# Patient Record
Sex: Male | Born: 1965
Health system: Southern US, Community
[De-identification: ages and names within clinical notes are randomized; demographics above are authoritative.]

## PROBLEM LIST (undated history)

## (undated) DIAGNOSIS — E1169 Type 2 diabetes mellitus with other specified complication: Secondary | ICD-10-CM

## (undated) DIAGNOSIS — E119 Type 2 diabetes mellitus without complications: Secondary | ICD-10-CM

## (undated) DIAGNOSIS — I10 Essential (primary) hypertension: Secondary | ICD-10-CM

## (undated) DIAGNOSIS — G4733 Obstructive sleep apnea (adult) (pediatric): Secondary | ICD-10-CM

## (undated) HISTORY — DX: Obstructive sleep apnea (adult) (pediatric): G47.33

## (undated) HISTORY — DX: Hyperlipidemia, unspecified: E11.69

## (undated) HISTORY — PX: FEMUR FRACTURE SURGERY: SHX633

## (undated) HISTORY — DX: Morbid (severe) obesity due to excess calories: E66.01

## (undated) HISTORY — DX: Type 2 diabetes mellitus without complications: E11.9

## (undated) HISTORY — PX: APPENDECTOMY: SHX54

---

## 2004-12-07 ENCOUNTER — Emergency Department (HOSPITAL_COMMUNITY): Admission: AD | Admit: 2004-12-07 | Discharge: 2004-12-07 | Payer: Self-pay | Admitting: Family Medicine

## 2009-01-23 ENCOUNTER — Encounter: Admission: RE | Admit: 2009-01-23 | Discharge: 2009-04-23 | Payer: Self-pay | Admitting: Family Medicine

## 2009-08-02 ENCOUNTER — Observation Stay (HOSPITAL_COMMUNITY): Admission: EM | Admit: 2009-08-02 | Discharge: 2009-08-03 | Payer: Self-pay | Admitting: Emergency Medicine

## 2009-08-02 ENCOUNTER — Encounter (INDEPENDENT_AMBULATORY_CARE_PROVIDER_SITE_OTHER): Payer: Self-pay | Admitting: Surgery

## 2010-02-18 ENCOUNTER — Emergency Department (HOSPITAL_COMMUNITY): Admission: EM | Admit: 2010-02-18 | Discharge: 2010-02-18 | Payer: Self-pay | Admitting: Family Medicine

## 2010-12-25 LAB — DIFFERENTIAL
Eosinophils Absolute: 0.1 10*3/uL (ref 0.0–0.7)
Eosinophils Relative: 0 % (ref 0–5)
Lymphs Abs: 2 10*3/uL (ref 0.7–4.0)
Monocytes Relative: 13 % — ABNORMAL HIGH (ref 3–12)

## 2010-12-25 LAB — CBC
HCT: 37.7 % — ABNORMAL LOW (ref 39.0–52.0)
Hemoglobin: 13.2 g/dL (ref 13.0–17.0)
MCV: 89.4 fL (ref 78.0–100.0)
RBC: 4.21 MIL/uL — ABNORMAL LOW (ref 4.22–5.81)
WBC: 12.1 10*3/uL — ABNORMAL HIGH (ref 4.0–10.5)

## 2013-09-23 ENCOUNTER — Ambulatory Visit: Payer: Self-pay

## 2013-10-04 ENCOUNTER — Ambulatory Visit: Payer: Self-pay

## 2014-10-18 DIAGNOSIS — F331 Major depressive disorder, recurrent, moderate: Secondary | ICD-10-CM | POA: Insufficient documentation

## 2014-12-11 ENCOUNTER — Telehealth: Payer: Self-pay | Admitting: Internal Medicine

## 2014-12-11 NOTE — Telephone Encounter (Signed)
Called and spoke to patient about appt.  He wanted to speak to Dr. Duanne Guessewey prior to scheduling an appointment due to not knowing the details of why he was being referred to our office.   Dx: Elevated Protein C levels Referring: Dr. Duanne Guessewey

## 2014-12-11 NOTE — Telephone Encounter (Signed)
Patient called back to make appt with Dr. Arbutus PedMohamed on 01/17/15 @ 11am.

## 2015-01-12 ENCOUNTER — Telehealth: Payer: Self-pay | Admitting: Internal Medicine

## 2015-01-12 NOTE — Telephone Encounter (Signed)
NEW PATIENT APPT- LEFT MESSAGE FOR PATIENT TO RETURN CALL TO RESCHEDULE NP APPT.

## 2015-01-15 ENCOUNTER — Telehealth: Payer: Self-pay | Admitting: Internal Medicine

## 2015-01-15 NOTE — Telephone Encounter (Signed)
new patient referral-left message for patien to return call to r/s np appt

## 2015-01-16 ENCOUNTER — Telehealth: Payer: Self-pay | Admitting: Internal Medicine

## 2015-01-16 NOTE — Telephone Encounter (Signed)
Call home number [phone] not able to leave message vm stated memory is full

## 2015-01-16 NOTE — Telephone Encounter (Signed)
new patient appt-left message for patient at [phone] informing of appt change. left messages on 04/22, 04/25 no return call

## 2015-01-17 ENCOUNTER — Other Ambulatory Visit: Payer: Self-pay

## 2015-01-17 ENCOUNTER — Ambulatory Visit: Payer: Self-pay

## 2015-01-17 ENCOUNTER — Ambulatory Visit: Payer: Self-pay | Admitting: Internal Medicine

## 2015-02-26 DIAGNOSIS — I739 Peripheral vascular disease, unspecified: Secondary | ICD-10-CM | POA: Insufficient documentation

## 2015-02-27 ENCOUNTER — Telehealth: Payer: Self-pay | Admitting: Cardiovascular Disease

## 2015-02-27 NOTE — Telephone Encounter (Signed)
Received records from Dr [facility] for appointment on 03/01/15 with Dr Tresa EndoKelly.  [facility] for Dr Landry DykeKelly's schedule on 03/01/15. lp

## 2015-03-01 ENCOUNTER — Ambulatory Visit: Payer: Self-pay | Admitting: Cardiovascular Disease

## 2015-03-23 DIAGNOSIS — M179 Osteoarthritis of knee, unspecified: Secondary | ICD-10-CM | POA: Insufficient documentation

## 2015-03-30 DIAGNOSIS — R55 Syncope and collapse: Secondary | ICD-10-CM | POA: Insufficient documentation

## 2015-04-05 ENCOUNTER — Telehealth: Payer: Self-pay | Admitting: Internal Medicine

## 2015-04-05 NOTE — Telephone Encounter (Signed)
Received [facility] for appointment on 06/04/15 with Dr Rennis GoldenHilty.  Records given to D Chavis (medical records) [facility]. lop

## 2015-06-04 ENCOUNTER — Ambulatory Visit: Payer: Self-pay | Admitting: Internal Medicine

## 2015-12-17 MED FILL — METFORMIN HCL ER 750 MG TAB: 750 | 30 days supply | Qty: 30 | Fill #0

## 2018-06-07 ENCOUNTER — Ambulatory Visit: Payer: BLUE CROSS/BLUE SHIELD | Admitting: Nurse Practitioner

## 2018-06-07 DIAGNOSIS — Z0289 Encounter for other administrative examinations: Secondary | ICD-10-CM

## 2018-06-14 DIAGNOSIS — Z125 Encounter for screening for malignant neoplasm of prostate: Secondary | ICD-10-CM | POA: Diagnosis not present

## 2018-06-14 DIAGNOSIS — Z13220 Encounter for screening for lipoid disorders: Secondary | ICD-10-CM | POA: Diagnosis not present

## 2018-06-14 DIAGNOSIS — Z0000 Encounter for general adult medical examination without abnormal findings: Secondary | ICD-10-CM | POA: Diagnosis not present

## 2018-06-28 DIAGNOSIS — Z0000 Encounter for general adult medical examination without abnormal findings: Secondary | ICD-10-CM | POA: Diagnosis not present

## 2018-06-29 MED FILL — OLMSRTN-AMLDPN-HCTZ 40-10-1: 40-10-12.5 | 90 days supply | Qty: 90 | Fill #0

## 2018-06-29 MED FILL — PRAVASTATIN SODIUM 80 MG TA: 80 | 90 days supply | Qty: 90 | Fill #0

## 2018-06-29 MED FILL — DICLOFENAC SODIUM 1% GEL: 1 | 30 days supply | Qty: 500 | Fill #0

## 2018-06-29 MED FILL — metFORMIN HCL 1000 MG TABS: 1000 | 30 days supply | Qty: 60 | Fill #0

## 2018-07-27 MED FILL — TRULICITY 0.75 MG/0.5 ML PE: 0.75 | 28 days supply | Qty: 2 | Fill #0

## 2018-10-22 MED FILL — metFORMIN HCL 1000 MG TABS: 1000 | 30 days supply | Qty: 60 | Fill #1

## 2019-05-24 DIAGNOSIS — R931 Abnormal findings on diagnostic imaging of heart and coronary circulation: Secondary | ICD-10-CM

## 2019-05-24 HISTORY — DX: Abnormal findings on diagnostic imaging of heart and coronary circulation: R93.1

## 2019-05-24 HISTORY — PX: TRANSTHORACIC ECHOCARDIOGRAM: SHX275

## 2019-06-03 DIAGNOSIS — E7800 Pure hypercholesterolemia, unspecified: Secondary | ICD-10-CM | POA: Diagnosis not present

## 2019-06-03 DIAGNOSIS — F331 Major depressive disorder, recurrent, moderate: Secondary | ICD-10-CM | POA: Diagnosis not present

## 2019-06-03 DIAGNOSIS — E6609 Other obesity due to excess calories: Secondary | ICD-10-CM | POA: Diagnosis not present

## 2019-06-03 DIAGNOSIS — I10 Essential (primary) hypertension: Secondary | ICD-10-CM | POA: Diagnosis not present

## 2019-06-03 DIAGNOSIS — E118 Type 2 diabetes mellitus with unspecified complications: Secondary | ICD-10-CM | POA: Diagnosis not present

## 2019-06-03 DIAGNOSIS — E1165 Type 2 diabetes mellitus with hyperglycemia: Secondary | ICD-10-CM | POA: Diagnosis not present

## 2019-06-03 DIAGNOSIS — I2510 Atherosclerotic heart disease of native coronary artery without angina pectoris: Secondary | ICD-10-CM | POA: Diagnosis not present

## 2019-06-03 DIAGNOSIS — E663 Overweight: Secondary | ICD-10-CM | POA: Diagnosis not present

## 2019-06-03 DIAGNOSIS — R5383 Other fatigue: Secondary | ICD-10-CM | POA: Diagnosis not present

## 2019-06-03 MED FILL — TRULICITY 0.75 MG/0.5 ML PE: 0.75 | 84 days supply | Qty: 6 | Fill #0

## 2019-06-03 MED FILL — VENLAFAXINE HCL ER 150 MG C: 150 | 30 days supply | Qty: 30 | Fill #0

## 2019-06-03 MED FILL — LOSARTAN POTASSIUM 100 MG T: 100 | 30 days supply | Qty: 30 | Fill #0

## 2019-06-03 MED FILL — AMLODIPINE BESYLATE 5 MG TA: 5 | 30 days supply | Qty: 30 | Fill #0

## 2019-06-03 MED FILL — FREESTYLE LANCETS: 90 days supply | Qty: 400 | Fill #0

## 2019-06-03 MED FILL — PRAVASTATIN SODIUM 80 MG TA: 80 | 30 days supply | Qty: 30 | Fill #0

## 2019-06-03 MED FILL — JARDIANCE 10 MG TABLET: 10 | 90 days supply | Qty: 90 | Fill #0

## 2019-06-03 MED FILL — FREESTYLE LITE TEST STRIP: 90 days supply | Qty: 400 | Fill #0

## 2019-06-03 MED FILL — FREESTYLE LITE METER: 1 days supply | Qty: 1 | Fill #0

## 2019-06-04 ENCOUNTER — Emergency Department (HOSPITAL_COMMUNITY)
Admission: EM | Admit: 2019-06-04 | Discharge: 2019-06-04 | Disposition: A | Discharge status: Home / Self Care | Payer: 59 | Attending: Emergency Medicine | Admitting: Emergency Medicine

## 2019-06-04 ENCOUNTER — Encounter (HOSPITAL_COMMUNITY): Payer: Self-pay | Admitting: Emergency Medicine

## 2019-06-04 ENCOUNTER — Other Ambulatory Visit: Payer: Self-pay

## 2019-06-04 DIAGNOSIS — Z79899 Other long term (current) drug therapy: Secondary | ICD-10-CM | POA: Diagnosis not present

## 2019-06-04 DIAGNOSIS — Z20828 Contact with and (suspected) exposure to other viral communicable diseases: Secondary | ICD-10-CM | POA: Diagnosis not present

## 2019-06-04 DIAGNOSIS — Z7982 Long term (current) use of aspirin: Secondary | ICD-10-CM | POA: Insufficient documentation

## 2019-06-04 DIAGNOSIS — R55 Syncope and collapse: Secondary | ICD-10-CM | POA: Diagnosis not present

## 2019-06-04 DIAGNOSIS — R42 Dizziness and giddiness: Secondary | ICD-10-CM | POA: Insufficient documentation

## 2019-06-04 DIAGNOSIS — I10 Essential (primary) hypertension: Secondary | ICD-10-CM | POA: Diagnosis not present

## 2019-06-04 DIAGNOSIS — E119 Type 2 diabetes mellitus without complications: Secondary | ICD-10-CM | POA: Diagnosis not present

## 2019-06-04 DIAGNOSIS — Z7984 Long term (current) use of oral hypoglycemic drugs: Secondary | ICD-10-CM | POA: Insufficient documentation

## 2019-06-04 HISTORY — DX: Essential (primary) hypertension: I10

## 2019-06-04 LAB — CBC
HCT: 49 % (ref 39.0–52.0)
Hemoglobin: 16.4 g/dL (ref 13.0–17.0)
MCH: 30.1 pg (ref 26.0–34.0)
MCHC: 33.5 g/dL (ref 30.0–36.0)
MCV: 90.1 fL (ref 80.0–100.0)
Platelets: 263 10*3/uL (ref 150–400)
RBC: 5.44 MIL/uL (ref 4.22–5.81)
RDW: 12.6 % (ref 11.5–15.5)
WBC: 11.6 10*3/uL — ABNORMAL HIGH (ref 4.0–10.5)
nRBC: 0 % (ref 0.0–0.2)

## 2019-06-04 LAB — BASIC METABOLIC PANEL
Anion gap: 14 (ref 5–15)
BUN: 16 mg/dL (ref 6–20)
CO2: 22 mmol/L (ref 22–32)
Calcium: 9.3 mg/dL (ref 8.9–10.3)
Chloride: 101 mmol/L (ref 98–111)
Creatinine, Ser: 0.85 mg/dL (ref 0.61–1.24)
GFR calc Af Amer: >60 mL/min (ref >60)
GFR calc non Af Amer: >60 mL/min (ref >60)
Glucose, Bld: 119 mg/dL — ABNORMAL HIGH (ref 70–99)
Potassium: 4 mmol/L (ref 3.5–5.1)
Sodium: 137 mmol/L (ref 135–145)

## 2019-06-04 LAB — TROPONIN I (HIGH SENSITIVITY)
Troponin I (High Sensitivity): 14 ng/L (ref <18)
Troponin I (High Sensitivity): 12 ng/L (ref <18)

## 2019-06-04 MED ORDER — SODIUM CHLORIDE 0.9 % IV BOLUS
1000.0000 mL | Freq: Once | INTRAVENOUS | Status: AC
  Administered 2019-06-04: 1000 mL via INTRAVENOUS

## 2019-06-04 NOTE — ED Provider Notes (Signed)
[facility]-EMERGENCY DEPT Provider Note   CSN: [id number] Arrival date & time: 06/04/19  1801     History   Chief Complaint Chief Complaint  Patient presents with  . Dizziness    HPI Robert Lewis is a 53 y.o. male with a past medical history of obesity, type 2 diabetes, hypertension, chewing tobacco use, presented to the emergency department with episode of near syncope.  Patient reports that he was on the toilet yesterday attempting have a bowel movement and straining, when he began to feel very lightheaded and nauseous.  This lasted about 30 seconds and then improved.  He did not lose consciousness.  He denies any palpitations or chest pain at that time.  He says is never happened before.  Today he reports while he was driving around 4 PM he again had a transient episode of lightheadedness, this time in the setting of no straining.  This lasted a few seconds and improved.  Of note the patient was started on losartan 100 mg by mouth daily is a new blood pressure medicine which he take his first dose of this morning.  He was also started on Januvia and Trulicity for his diabetes.  He has not been taking his Januvia as instructed by his primary care provider until he is able to obtain a meter to evaluate his blood sugars.  Patient reports feeling general malaise with headaches and "not like myself" for the past month.  He denies any cough congestion or sick contacts.  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  He denies any history of exertional chest pain or dyspnea on exertion.  He reports he did stress test many years ago but has not followed up with cardiology since then.  He reports a family history of MI in his father who died at age 34.  No hemoptysis or asymmetric LE edema. Patient denies personal or family history of DVT or PE. No recent hormone use (including OCP); travel for >6 hours; prolonged immobilization for greater than 3 days;  surgeries or trauma in the last 4 weeks; or malignancy with treatment within 6 months.    HPI  Past Medical History:  Diagnosis Date  . Diabetes mellitus without complication (HCC)   . Hypertension     There are no active problems to display for this patient.   History reviewed. No pertinent surgical history.      Home Medications    Prior to Admission medications   Medication Sig Start Date End Date Taking? Authorizing Provider  amLODipine (NORVASC) 5 MG tablet Take 5 mg by mouth daily. 06/03/19  Yes [provider]  aspirin EC 81 MG tablet Take 81 mg by mouth daily.   Yes [provider]  JARDIANCE 10 MG TABS tablet Take 10 mg by mouth daily. 06/03/19  Yes [provider]  losartan (COZAAR) 100 MG tablet Take 100 mg by mouth daily. 06/03/19  Yes [provider]  pravastatin (PRAVACHOL) 80 MG tablet Take 80 mg by mouth daily. 06/03/19  Yes [provider]  TRULICITY 0.75 MG/0.5ML SOPN Inject 0.75 mg into the skin every 7 (seven) weeks. 06/03/19  Yes [provider]  venlafaxine XR (EFFEXOR-XR) 150 MG 24 hr capsule Take 150 mg by mouth daily. 06/03/19  Yes [provider]    Family History No family history on file.  Social History Social History   Tobacco Use  . Smoking status: Not on file  Substance  Use Topics  . Alcohol use: Not on file  . Drug use: Not on file     Allergies   Patient has no known allergies.   Review of Systems Review of Systems  Constitutional: Negative for chills and fever.  Eyes: Negative for photophobia and visual disturbance.  Respiratory: Negative for cough and shortness of breath.   Cardiovascular: Negative for chest pain and palpitations.  Gastrointestinal: Positive for nausea. Negative for abdominal pain and vomiting.  Musculoskeletal: Negative for arthralgias and back pain.  Skin: Negative for pallor and rash.  Neurological: Positive for dizziness and light-headedness.  Negative for seizures, syncope, facial asymmetry, weakness and headaches.  Psychiatric/Behavioral: Negative for agitation and confusion.  All other systems reviewed and are negative.    Physical Exam Updated Vital Signs BP (!) 152/79   Pulse 67   Temp 99 F (37.2 C) (Oral)   Resp 20   SpO2 97%   Physical Exam Vitals signs and nursing note reviewed.  Constitutional:      Appearance: He is well-developed.     Comments: Obese  HENT:     Head: Normocephalic and atraumatic.  Eyes:     Conjunctiva/sclera: Conjunctivae normal.  Neck:     Musculoskeletal: Neck supple.  Cardiovascular:     Rate and Rhythm: Normal rate and regular rhythm.     Pulses: Normal pulses.     Heart sounds: No murmur.  Pulmonary:     Effort: Pulmonary effort is normal. No respiratory distress.     Breath sounds: Normal breath sounds.  Abdominal:     Palpations: Abdomen is soft.     Tenderness: There is no abdominal tenderness.  Skin:    General: Skin is warm and dry.  Neurological:     General: No focal deficit present.     Mental Status: He is alert and oriented to person, place, and time.     Sensory: No sensory deficit.     Motor: No weakness.     Gait: Gait normal.  Psychiatric:        Mood and Affect: Mood normal.        Behavior: Behavior normal.      ED Treatments / Results  Labs (all labs ordered are listed, but only abnormal results are displayed) Labs Reviewed  BASIC METABOLIC PANEL - Abnormal; Notable for the following components:      Result Value   Glucose, Bld 119 (*)    All other components within normal limits  CBC - Abnormal; Notable for the following components:   WBC 11.6 (*)    All other components within normal limits  SARS CORONAVIRUS 2 (TAT 6-24 HRS)  TROPONIN I (HIGH SENSITIVITY)  TROPONIN I (HIGH SENSITIVITY)    EKG EKG Interpretation  Date/Time:  Saturday June 04 2019 18:59:01 EDT Ventricular Rate:  76 PR Interval:    QRS Duration: 111 QT  Interval:  386 QTC Calculation: 434 R Axis:   -17 Text Interpretation:  Sinus rhythm Left ventricular hypertrophy No STEMI  Confirmed by [facility], Cadyn Fann [phone]([id number]) on 06/04/2019 8:18:59 PM   Radiology No results found.  Procedures Procedures (including critical care time)  Medications Ordered in ED Medications  sodium chloride 0.9 % bolus 1,000 mL (0 mLs Intravenous Stopped 06/04/19 2140)     Initial Impression / Assessment and Plan / ED Course  I have reviewed the triage vital signs and the nursing notes.  Pertinent labs & imaging results that were available during my care of the patient were  reviewed by me and considered in my medical decision making (see chart for details).  53 year old male with history as noted above presenting to the emergency department with 2 transient episodes of lightheadedness versus near syncope.  The first occurred while he was on the toilet having a bowel movement, and is quite consistent with a vasovagal episode.  He has reported that he has gotten lightheaded with vagal straining in the past.  Second episode occurred while driving today apparently in the setting of no stress.  Differential includes cardiac arrhythmia versus acute coronary syndrome versus hypoglycemia versus dehydration versus other.  He is PERC negative and demonstrates no hypoxia or persistent symptoms to suggest pulmonary embolism.  Plan to check troponins and EKG.  Will give IV fluids. Will check COVID-19, as his wife reports that the patient has been felt feeling unwell for approximately 1 month.  I had a very long discussion with the patient and his wife, who is a Marine scientist, regarding cardiac work-up.  They are aware that the patient has multiple cardiac risk factors including a family history of MI.  I made them aware that even with a negative work-up in the ER today, I cannot guarantee that his symptoms are not related to his heart.  He will need to see a cardiologist in the next week or  2 and establish care.  He may need an echo cardiogram or stress test done as an outpatient.  He verbalized understanding of this.  We will await a second troponin and he and his wife will discuss whether they wish to stay in the hospital or monitor symptoms from home.  At this time he is completely asymptomatic and feeling well.  Clinical Course as of Jun 04 2323  Sat Jun 04, 2019  2121 2nd trop negative   [MT]  2142 Patient is feeling better with IV fluids.  We discussed the risks and benefits of him staying for further cardiac monitoring and echo versus discharge home with follow-up as an outpatient.  He was strongly prefer to go home at this time.  He lives with his wife who is a Marine scientist, reports that she can measure his blood pressure at home and ensure that he is keeping plenty of fluid down.  I gave him close return precautions.     [MT]    Clinical Course User Index [MT] Wyvonnia Dusky, MD       Final Clinical Impressions(s) / ED Diagnoses   Final diagnoses:  Near syncope    ED Discharge Orders         Ordered    Ambulatory referral to Cardiology     06/04/19 2143           Wyvonnia Dusky, MD 06/04/19 2324

## 2019-06-04 NOTE — Discharge Instructions (Signed)
We discussed your heart health in the ER today.  We decided together you can monitor this at home and see a heart doctor in the nearby future.  Continue monitoring your blood pressure at home.  Drink LOTS of water.  If you develop any further symptoms or have more episodes of your lightheadedness, call 911 and return to the ER.  Please quarantine until your covid test results.

## 2019-06-04 NOTE — ED Triage Notes (Signed)
Pt reports been off HTN meds for couple weeks and started back yesterday. reports since yesterday feeling lightheaded when moving around.

## 2019-06-05 LAB — SARS CORONAVIRUS 2 (TAT 6-24 HRS): SARS Coronavirus 2: NEGATIVE

## 2019-06-06 ENCOUNTER — Encounter: Payer: Self-pay | Admitting: Cardiology

## 2019-06-06 ENCOUNTER — Ambulatory Visit (INDEPENDENT_AMBULATORY_CARE_PROVIDER_SITE_OTHER): Payer: 59 | Admitting: Cardiology

## 2019-06-06 ENCOUNTER — Other Ambulatory Visit: Payer: Self-pay

## 2019-06-06 VITALS — BP 153/91 | HR 110 | Ht 74.0 in | Wt 376.4 lb

## 2019-06-06 DIAGNOSIS — E119 Type 2 diabetes mellitus without complications: Secondary | ICD-10-CM

## 2019-06-06 DIAGNOSIS — I10 Essential (primary) hypertension: Secondary | ICD-10-CM | POA: Diagnosis not present

## 2019-06-06 DIAGNOSIS — E8881 Metabolic syndrome: Secondary | ICD-10-CM | POA: Insufficient documentation

## 2019-06-06 DIAGNOSIS — R55 Syncope and collapse: Secondary | ICD-10-CM | POA: Insufficient documentation

## 2019-06-06 NOTE — Patient Instructions (Addendum)
Medication Instructions:  NO CHANGES     Lab work: NOT NEEDED   Testing/Procedures: WILL BE SCHEDULE AT [address] Your physician has requested that you have an echocardiogram. Echocardiography is a painless test that uses sound waves to create images of your heart. It provides your doctor with information about the size and shape of your heart and how well your heart's chambers and valves are working. This procedure takes approximately one hour. There are no restrictions for this procedure.  AND   CT coronary calcium score. This test is done at [address]. This is $150 out of pocket.   Coronary CalciumScan A coronary calcium scan is an imaging test used to look for deposits of calcium and other fatty materials (plaques) in the inner lining of the blood vessels of the heart (coronary arteries). These deposits of calcium and plaques can partly clog and narrow the coronary arteries without producing any symptoms or warning signs. This puts a person at risk for a heart attack. This test can detect these deposits before symptoms develop. Tell a health care provider about:  Any allergies you have.  All medicines you are taking, including vitamins, herbs, eye drops, creams, and over-the-counter medicines.  Any problems you or family members have had with anesthetic medicines.  Any blood disorders you have.  Any surgeries you have had.  Any medical conditions you have.  Whether you are pregnant or may be pregnant. What are the risks? Generally, this is a safe procedure. However, problems may occur, including:  Harm to a pregnant woman and her unborn baby. This test involves the use of radiation. Radiation exposure can be dangerous to a pregnant woman and her unborn baby. If you are pregnant, you generally should not have this procedure done.  Slight increase in the risk of cancer. This is because of the radiation involved in the  test. What happens before the procedure? No preparation is needed for this procedure. What happens during the procedure?  You will undress and remove any jewelry around your neck or chest.  You will put on a hospital gown.  Sticky electrodes will be placed on your chest. The electrodes will be connected to an electrocardiogram (ECG) machine to record a tracing of the electrical activity of your heart.  A CT scanner will take pictures of your heart. During this time, you will be asked to lie still and hold your breath for 2-3 seconds while a picture of your heart is being taken. The procedure may vary among health care providers and hospitals. What happens after the procedure?  You can get dressed.  You can return to your normal activities.  It is up to you to get the results of your test. Ask your health care provider, or the department that is doing the test, when your results will be ready. Summary  A coronary calcium scan is an imaging test used to look for deposits of calcium and other fatty materials (plaques) in the inner lining of the blood vessels of the heart (coronary arteries).  Generally, this is a safe procedure. Tell your health care provider if you are pregnant or may be pregnant.  No preparation is needed for this procedure.  A CT scanner will take pictures of your heart.  You can return to your normal activities after the scan is done. This information is not intended to replace advice given to you by your health care provider. Make sure you discuss  any questions you have with your health care provider. Document Released: 03/06/2008 Document Revised: 07/28/2016 Document Reviewed: 07/28/2016 Elsevier Interactive Patient Education  2017 [place] physician has recommended that you wear a 14 DAY ZIO-PATCH monitor. The Zio patch cardiac monitor continuously records heart rhythm data for up to 14 days, this is for patients being evaluated for multiple  types heart rhythms. For the first 24 hours post application, please avoid getting the Zio monitor wet in the shower or by excessive sweating during exercise. After that, feel free to carry on with regular activities. Keep soaps and lotions away from the ZIO XT Patch.  This will be placed at our [facility] location - [address].        Follow-Up: At [facility], you and your health needs are our priority.  As part of our continuing mission to provide you with exceptional heart care, we have created designated Provider Care Teams.  These Care Teams include your primary Cardiologist (physician) and Advanced Practice Providers (APPs -  Physician Assistants and Nurse Practitioners) who all work together to provide you with the care you need, when you need it. . You will need a follow up appointment in 2  months.  Please call our office 2 months in advance to schedule this appointment.  You may see Glenetta Hew, MD or one of the following Advanced Practice Providers on your designated Care Team:   . Rosaria Ferries, PA-C . Jory Sims, DNP, ANP  Any Other Special Instructions Will Be Listed Below (If Applicable).

## 2019-06-06 NOTE — Progress Notes (Signed)
PCP: Marin Comment, FNP  Clinic Note: Chief Complaint  Patient presents with   Hospitalization Follow-up    ER follow-up   Near Syncope    2 different episodes this past weekend    HPI: ZAKKARY THIBAULT is a 53 y.o. male with a PMH of HTN, DM-2, HLD & Obesity who presents today for ER f/u for Near Syncope. YASHAS CAMILLI is being seen now as a new patient.  Recent Hospitalizations: ER visit 06/04/2019  He went to the emergency room at the request of his wife after a second episode of near syncope/dizziness. ? First episode occurred during the day of 06/03/2019 he was somewhat constipated, and was trying to have a bowel movement.  It was likely straining for some time.  During this episode he began to feel lightheaded and nauseated.  He never lost consciousness but did get very woozy.  He then got dizzy when he first stood up, but it resolved relatively quickly.  No associated palpitations ? Second episode occurred the following day on the 12th.  While driving, he noted having an episode of feeling lightheaded and dizzy that lasted a few seconds.  He did not have a way of checking his blood sugars.  But he felt a little bit sweaty but he does sweat a significant amount anyway. ? Has otherwise been having a little bit of sensation of malaise and headaches and not feeling well for the past few months.  Studies Personally Reviewed - (if available, images/films reviewed: From Epic Chart or Care Everywhere)  None  Interval History: Wei presents here today for evaluation of his dizzy spells.  He is short of breath simply walking in to the clinic room and his heart rate is very fast.  He is very sweaty and feels overheated.  Somewhat flushed, and breathing heavy.  He chalks this up to the fact that is somewhat hot outside and walk inside he felt a little dizzy.No chest pain or shortness of breath with rest or exertion. No PND, orthopnea or edema. No palpitations, lightheadedness,  dizziness, weakness or syncope/near syncopeBoth now and at the time of his ER visit, he denied any sensation of rapid irregular heartbeats or palpitations.  He never had any loss of consciousness.  He does indicate that he does not really do a good job of hydrating himself.  He also has not been on a good about checking his blood sugars.  He denies any recent sick contacts.  No fevers chills colds or sweats.  No new coughing or wheezing.  His exertional dyspnea is pretty consistent for him.  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  He laughingly asks "do I look like I exercise?"  At no time was he noted any chest tightness or pressure with rest or exertion.  He sleeps sitting up a little bit more because his wife has GERD then because he does.  He has mild puffy swelling in his legs but not necessarily edema.  No orthopnea that he can tell, but he uses CPAP for OSA.  Again no rapid irregular heartbeats or palpitations..  No TIA/amaurosis fugax symptoms.  No claudication.. .  ROS: A comprehensive was performed. Review of Systems  Constitutional: Positive for malaise/fatigue (Does not have much energy.  Very deconditioned). Negative for weight loss.  HENT: Negative for congestion and nosebleeds.   Respiratory: Positive for shortness of breath (Deconditioned). Negative for cough and wheezing.  Gastrointestinal: Positive for constipation. Negative for abdominal pain, blood in stool, heartburn and melena.  Genitourinary: Negative for hematuria.  Musculoskeletal: Positive for joint pain (Hips and knees).  Neurological: Positive for dizziness, tingling (Tingling sensation when he was dizzy) and headaches. Negative for focal weakness, loss of consciousness and weakness.  Psychiatric/Behavioral: Negative for memory loss. The patient is not nervous/anxious and does not have insomnia.   All other systems reviewed and are negative.  The patient does  not have symptoms concerning for COVID-19 infection (fever, chills, cough, or new shortness of breath).  The patient is practicing social distancing.   COVID-19 Education: The signs and symptoms of COVID-19 were discussed with the patient and how to seek care for testing (follow up with PCP or arrange E-visit).   The importance of social distancing was discussed today.   I have reviewed and (if needed) personally updated the patient's problem list, medications, allergies, past medical and surgical history, social and family history.   Past Medical History:  Diagnosis Date   Controlled diabetes mellitus type II without complication ([place])    Not on insulin   Hyperlipidemia associated with type 2 diabetes mellitus ([place])    Hypertension    Morbid obesity with BMI of 45.0-49.9, adult (HCC)    OSA on CPAP     Past Surgical History:  Procedure Laterality Date   APPENDECTOMY     FEMUR FRACTURE SURGERY Left    ORIF with pin in place    Current Meds  Medication Sig   amLODipine (NORVASC) 5 MG tablet Take 5 mg by mouth daily.   aspirin EC 81 MG tablet Take 81 mg by mouth daily.   JARDIANCE 10 MG TABS tablet Take 10 mg by mouth daily.   losartan (COZAAR) 100 MG tablet Take 100 mg by mouth daily.   pravastatin (PRAVACHOL) 80 MG tablet Take 80 mg by mouth daily.   TRULICITY 8.84 ZY/6.0YT SOPN Inject 0.75 mg into the skin every 7 (seven) weeks.   venlafaxine XR (EFFEXOR-XR) 150 MG 24 hr capsule Take 150 mg by mouth daily.    No Known Allergies  Social History   Tobacco Use   Smoking status: Never Smoker   Smokeless tobacco: Current User    Types: Chew  Substance Use Topics   Alcohol use: Not on file    Comment: Rare   Drug use: Never   Social History   Social History Narrative   He self acknowledges that he is out of shape.  Does not exercise.  He does have to walk quite a long ways from the parking lot to where his truck is parked for work, and sometimes  loading off loading the truck can be difficult.    family history includes Heart attack in his paternal grandfather; Heart attack (age of onset: 76) in his father; Prostate cancer in his father; Pulmonary embolism (age of onset: 30) in his father.  Wt Readings from Last 3 Encounters:  06/06/19 (!) 376 lb 6.4 oz (170.7 kg)    PHYSICAL EXAM BP (!) 153/91    Pulse (!) 110    Ht 6\' 2"  (1.88 m)    Wt (!) 376 lb 6.4 oz (170.7 kg)    SpO2 99%    BMI 48.33 kg/m  Physical Exam  Constitutional: He is oriented to person, place, and time. No distress.  Morbidly obese.  Increased work of breathing simply walking into the clinic  HENT:  Head: Normocephalic and atraumatic.  Mouth/Throat: Oropharynx is clear  and moist.  Heavy beard.  Very sweaty.  Wearing mask for COVID  Eyes: Pupils are equal, round, and reactive to light. Conjunctivae and EOM are normal. No scleral icterus.  Neck: Normal range of motion. Neck supple. No JVD (Unable to assess) present. Carotid bruit is not present.  Cardiovascular: Regular rhythm, S1 normal, S2 normal and intact distal pulses.  No extrasystoles are present. Tachycardia present. PMI is not displaced (Unable to assess). Exam reveals distant heart sounds and decreased pulses (Difficult to palpate pedal pulses because of obesity).  Unable to determine if there is any murmurs rubs or gallops due to distant heart sounds  Pulmonary/Chest: Effort normal. No respiratory distress. He has no wheezes. He has no rales. He exhibits no tenderness.  Distant breath sounds  Abdominal: Soft. Bowel sounds are normal. He exhibits no distension. There is no abdominal tenderness. There is no rebound.  Morbidly obese.  Unable to assess HSM  Musculoskeletal: Normal range of motion.        General: No edema (Unable to distinguish between obesity and edema).  Neurological: He is alert and oriented to person, place, and time. No cranial nerve deficit.  Skin: Skin is warm. No erythema (Somewhat  flushed when he first walked in). No pallor.  Diffusely sweaty  Psychiatric: He has a normal mood and affect. His behavior is normal. Judgment and thought content normal.  Vitals reviewed.    Adult ECG Report ER EKG reviewed  Rate: 82 ;  Rhythm: normal sinus rhythm; borderline LVH  Narrative Interpretation: Relatively normal EKG   Other studies Reviewed: Additional studies/ records that were reviewed today include:  Recent Labs:   Lab Results  Component Value Date   CREATININE 0.85 06/04/2019   BUN 16 06/04/2019   NA 137 06/04/2019   K 4.0 06/04/2019   CL 101 06/04/2019   CO2 22 06/04/2019     ASSESSMENT / PLAN: Problem List Items Addressed This Visit    Essential hypertension (Chronic)    Blood pressure is high today.  He is on high-dose amlodipine and losartan.  I rechecked his blood pressure and it was a little better.  Reassess in follow-up.  Target blood pressure should be in the 120 range.  Being on afterload reducing agents and vasodilators would make him susceptible to vasovagal syncopal event.  Encourage adequate hydration      Metabolic syndrome (Chronic)    Combination of morbid obesity, diabetes mellitus, hypertension and dyslipidemia make up metabolic syndrome.  I explained to him that this does increase his overall cardiovascular risk.  This in conjunction with his family history has been somewhat concerned.  He does not really do much activity, but with what he does I do not see any need to do a stress test I think at baseline evaluation with a screening coronary calcium score will be beneficial.  Plan: Coronary calcium score.  Based on this result we could consider would not want to do a stress test as it would false positive.      Relevant Orders   CT CARDIAC SCORING   Morbid obesity (HCC) (Chronic)    Significantly obese.  Very deconditioned.  I counseled him that with his father and paternal grandfather both having MIs in their 5240s, he is at  increased risk.  Strongly recommended adjusting his diet.  He asked about the keto diet and I actually suggest more realistic diet such as a semblance of the DASH diet or the Mediterranean diet.  Also strongly encouraged him  to increase his exercise level.  Perhaps in between his driving legs he can take a walk.  If it bothers him to be outside in the heat, he can do it inside.      Near syncope - Primary (Chronic)    I completely agree with the ER physician assessment.  I think the first episode occurring while straining on the toilet is very consistent with a vasovagal episode. The very fact that he acknowledges that he does not hydrate well enough and he profusely sweats with me that he is susceptible to this.  The second episode did not sound like it is anything to do with arrhythmia.  Probably additional lightheadedness from hypotension/dehydration even potentially hypoglycemia.  He does not describe feeling any irregular heartbeats, but he does have a tachycardia now.  Want to exclude the potential of NSVT for the second episode.  Plan: Zio patch.  We will also check a 2D echocardiogram to exclude any structural abnormalities.  Physical exam is very limited.      Relevant Orders   ECHOCARDIOGRAM COMPLETE   LONG TERM MONITOR (3-14 DAYS)   Type 2 diabetes mellitus without complication, without long-term current use of insulin (HCC) (Chronic)    He has been started on new medications.  It is possible that the second episode was related to hypoglycemia.  With his obesity and hypertension as well as hyperlipidemia he is at increased risk for cardiac disease.  Need to ensure adequate glycemic control at this point.  He is on Jardiance which does provide some cardioprotection.       Relevant Orders   CT CARDIAC SCORING      I spent a total of 35 minutes with the patient and chart review. >  50% of the time was spent in direct patient consultation.   Current medicines are reviewed at  length with the patient today.  (+/- concerns) n/a The following changes have been made:  n/a  Patient Instructions  Medication Instructions:  NO CHANGES     Lab work: NOT NEEDED   Testing/Procedures: WILL BE SCHEDULE AT [address] Your physician has requested that you have an echocardiogram. Echocardiography is a painless test that uses sound waves to create images of your heart. It provides your doctor with information about the size and shape of your heart and how well your hearts chambers and valves are working. This procedure takes approximately one hour. There are no restrictions for this procedure.  AND   CT coronary calcium score. This test is done at [address]. This is $150 out of pocket.  AND  Your physician has recommended that you wear a 14 DAY ZIO-PATCH monitor.   Follow-Up:   You will need a follow up appointment in 2  months.  Please call our office 2 months in advance to schedule this appointment.  You may see Bryan Lemma, MD or one of the following Advanced Practice Providers on your designated Care Team:    Theodore Demark, PA-C  Joni Reining, DNP, ANP  Any Other Special Instructions Will Be Listed Below (If Applicable).    Studies Ordered:   Orders Placed This Encounter  Procedures   CT CARDIAC SCORING   LONG TERM MONITOR (3-14 DAYS)   ECHOCARDIOGRAM COMPLETE      Bryan Lemma, M.D., M.S. Interventional Cardiologist   Pager # [phone] Phone # [phone] [address] [place], [place] [address]   Thank you for choosing Heartcare  at [facility]

## 2019-06-07 DIAGNOSIS — E118 Type 2 diabetes mellitus with unspecified complications: Secondary | ICD-10-CM | POA: Diagnosis not present

## 2019-06-07 DIAGNOSIS — E7800 Pure hypercholesterolemia, unspecified: Secondary | ICD-10-CM | POA: Diagnosis not present

## 2019-06-07 DIAGNOSIS — F331 Major depressive disorder, recurrent, moderate: Secondary | ICD-10-CM | POA: Diagnosis not present

## 2019-06-08 ENCOUNTER — Encounter: Payer: Self-pay | Admitting: Cardiology

## 2019-06-08 NOTE — Assessment & Plan Note (Signed)
Significantly obese.  Very deconditioned.  I counseled him that with his father and paternal grandfather both having MIs in their 78s, he is at increased risk.  Strongly recommended adjusting his diet.  He asked about the keto diet and I actually suggest more realistic diet such as a semblance of the DASH diet or the Mediterranean diet.  Also strongly encouraged him to increase his exercise level.  Perhaps in between his driving legs he can take a walk.  If it bothers him to be outside in the heat, he can do it inside.

## 2019-06-08 NOTE — ED Provider Notes (Signed)
On my post-visit chart review, patient was seen by cardiologist Dr. Ellyn Hack on 9/14 and has further cardiac workup pending.  Noted that the patient was tachycardic during this office visit.  I attempted to reach out to the patient to determine how he is felling, but he and his wife are not answering their cell phones or house phones.  I left a voicemail on the patient's cellphone and on his wife's cell phone as well.   Wyvonnia Dusky, MD 06/08/19 [phone]

## 2019-06-08 NOTE — Assessment & Plan Note (Signed)
Combination of morbid obesity, diabetes mellitus, hypertension and dyslipidemia make up metabolic syndrome.  I explained to him that this does increase his overall cardiovascular risk.  This in conjunction with his family history has been somewhat concerned.  He does not really do much activity, but with what he does I do not see any need to do a stress test I think at baseline evaluation with a screening coronary calcium score will be beneficial.  Plan: Coronary calcium score.  Based on this result we could consider would not want to do a stress test as it would false positive.

## 2019-06-08 NOTE — Assessment & Plan Note (Signed)
I completely agree with the ER physician assessment.  I think the first episode occurring while straining on the toilet is very consistent with a vasovagal episode. The very fact that he acknowledges that he does not hydrate well enough and he profusely sweats with me that he is susceptible to this.  The second episode did not sound like it is anything to do with arrhythmia.  Probably additional lightheadedness from hypotension/dehydration even potentially hypoglycemia.  He does not describe feeling any irregular heartbeats, but he does have a tachycardia now.  Want to exclude the potential of NSVT for the second episode.  Plan: Zio patch.  We will also check a 2D echocardiogram to exclude any structural abnormalities.  Physical exam is very limited.

## 2019-06-08 NOTE — Assessment & Plan Note (Signed)
He has been started on new medications.  It is possible that the second episode was related to hypoglycemia.  With his obesity and hypertension as well as hyperlipidemia he is at increased risk for cardiac disease.  Need to ensure adequate glycemic control at this point.  He is on Jardiance which does provide some cardioprotection.

## 2019-06-08 NOTE — Assessment & Plan Note (Signed)
Blood pressure is high today.  He is on high-dose amlodipine and losartan.  I rechecked his blood pressure and it was a little better.  Reassess in follow-up.  Target blood pressure should be in the 120 range.  Being on afterload reducing agents and vasodilators would make him susceptible to vasovagal syncopal event.  Encourage adequate hydration

## 2019-06-10 ENCOUNTER — Telehealth: Payer: Self-pay

## 2019-06-10 NOTE — Telephone Encounter (Signed)
Spoke to pt. Gave brief details about monitor. Verified address. Ordered 14 day Zio to be mailed to pt.

## 2019-06-21 ENCOUNTER — Ambulatory Visit (HOSPITAL_COMMUNITY): Payer: 59 | Attending: Cardiology

## 2019-06-21 ENCOUNTER — Other Ambulatory Visit: Payer: Self-pay

## 2019-06-21 ENCOUNTER — Ambulatory Visit (INDEPENDENT_AMBULATORY_CARE_PROVIDER_SITE_OTHER)
Admission: RE | Admit: 2019-06-21 | Discharge: 2019-06-21 | Disposition: A | Discharge status: Home / Self Care | Payer: Self-pay | Source: Ambulatory Visit | Attending: Cardiology | Admitting: Cardiology

## 2019-06-21 DIAGNOSIS — R55 Syncope and collapse: Secondary | ICD-10-CM | POA: Insufficient documentation

## 2019-06-21 DIAGNOSIS — E8881 Metabolic syndrome: Secondary | ICD-10-CM

## 2019-06-21 DIAGNOSIS — E119 Type 2 diabetes mellitus without complications: Secondary | ICD-10-CM

## 2019-06-21 MED ORDER — PERFLUTREN LIPID MICROSPHERE
1.0000 mL | INTRAVENOUS | Status: AC | PRN
  Administered 2019-06-21: 2 mL via INTRAVENOUS

## 2019-06-27 ENCOUNTER — Telehealth: Payer: Self-pay | Admitting: Cardiology

## 2019-06-27 NOTE — Telephone Encounter (Signed)
Spoke with pt, aware final calcium score is not available yet.

## 2019-06-27 NOTE — Telephone Encounter (Signed)
New Message    Patient calling in for CT results please call patient back.

## 2019-06-29 ENCOUNTER — Telehealth: Payer: Self-pay | Admitting: *Deleted

## 2019-06-29 NOTE — Telephone Encounter (Signed)
LEFT MESSAGE TO CALL BACK - TO GIVE RESULTS - AND TO SCHEDULE A FOLLOW UP VISIT

## 2019-06-29 NOTE — Telephone Encounter (Signed)
-----   Message from Leonie Man, MD sent at 06/29/2019  2:09 AM EDT ----- We finally got the coronary calcium score.  This is low to intermediate with a coronary calcium score of 282.  This does suggest that there is evidence of heart artery atherosclerosis.  We do need to make sure that you are scheduled to come back for follow-up and we can talk about next line testing.  We could consider either a stress test or a coronary CT angiogram which is a little bit more detailed CT scan.  Glenetta Hew, MD

## 2019-06-29 NOTE — Telephone Encounter (Signed)
° ° °  Please return call to spouse with results

## 2019-06-29 NOTE — Telephone Encounter (Signed)
Routed to primary nurse 

## 2019-06-30 NOTE — Telephone Encounter (Signed)
LEFT MESSAGE ON WIFE'S VOICE MAIL - APPOINTMENT FOR THE PATIENT- 07/28/19 AT 11:40 AM   WITH DR HARDING.( WIFE MAY ATTEND IF NEEDED)   ANY QUESTION MAY CALL BACK OR IF NEED TO RESCHEDULE APPOINTMENT

## 2019-07-05 MED FILL — PRAVASTATIN SODIUM 80 MG TA: 80 | 90 days supply | Qty: 90 | Fill #0

## 2019-07-05 MED FILL — LOSARTAN POTASSIUM 100 MG T: 100 | 90 days supply | Qty: 90 | Fill #0

## 2019-07-05 MED FILL — VENLAFAXINE HCL ER 150 MG C: 150 | 90 days supply | Qty: 90 | Fill #0

## 2019-07-05 MED FILL — AMLODIPINE BESYLATE 5 MG TA: 5 | 90 days supply | Qty: 90 | Fill #0

## 2019-07-28 ENCOUNTER — Ambulatory Visit: Payer: 59 | Admitting: Cardiology

## 2019-08-15 ENCOUNTER — Encounter: Payer: Self-pay | Admitting: Cardiology

## 2019-08-15 ENCOUNTER — Ambulatory Visit: Payer: 59 | Admitting: Cardiology

## 2019-08-15 NOTE — Progress Notes (Deleted)
Primary Care Provider: Suzan Garibaldi, FNP Cardiologist: No primary care provider on file. Electrophysiologist:   Clinic Note: No chief complaint on file.    HPI:    Robert Lewis is a 53 y.o. male with a PMH below who presents today for 34-monthfollow-up to discuss results of studies.  TDEVONTRE SIEDSCHLAGwas last seen on September 14 for ER follow-up after recurrent episode of syncope/dizziness. --> We check 2D echo, Zio patch monitor and coronary calcium score.  Recent Hospitalizations: No further visits.  Reviewed  CV studies:    The following studies were reviewed today: (if available, images/films reviewed: From Epic Chart or Care Everywhere) . Monitor not done. . 2D Echo June 21, 2019: EF 60-65%.  Normal wall motion.  Mild LVH.  Normal RV size and function.  Normal atria.  Mild MAC.  Mild aortic sclerosis with no stenosis.--Essentially normal. . Coronary calcium score 282.  Findings suggestive of hepatic steatosis.   Interval History:   [name]   CV Review of Symptoms (Summary) Cardiovascular ROS: {roscv:310661}  The patient {does/does not:200015} have symptoms concerning for COVID-19 infection (fever, chills, cough, or new shortness of breath).  The patient {ACTION; IS/IS NAST:41962229}practicing social distancing. *** Masking.  *** Groceries/shopping. *** Work   REVIEWED OF SYSTEMS   A comprehensive ROS was performed. ROS   I have reviewed and (if needed) personally updated the patient's problem list, medications, allergies, past medical and surgical history, social and family history.   PAST MEDICAL HISTORY   Past Medical History:  Diagnosis Date  . Agatston coronary artery calcium score between 200 and 399 05/2019  . Controlled diabetes mellitus type II without complication (HCC)    Not on insulin  . Hyper[place] 2 diabetes [place]   . Hypertension   . Morbid obesity with BMI of 45.0-49.9, adult  ([place]   . OSA on CPAP      PAST SURGICAL HISTORY   Past Surgical History:  Procedure Laterality Date  . APPENDECTOMY    . FEMUR FRACTURE SURGERY Left    ORIF with pin in place  . TRANSTHORACIC ECHOCARDIOGRAM  05/2019   EF 60-65%.  Normal wall motion.  Mild LVH.  Normal RV size and function.  Normal atria.  Mild MAC.  Mild aortic sclerosis with no stenosis.--Essentially normal.     MEDICATIONS/ALLERGIES   No outpatient medications have been marked as taking for the 08/15/19 encounter (Appointment) with HLeonie Man MD.    No Known Allergies   SOCIAL HISTORY/FAMILY HISTORY   Social History   Tobacco Use  . Smoking status: Never Smoker  . Smokeless tobacco: Current User    Types: Chew  Substance Use Topics  . Alcohol use: Not on file    Comment: Rare  . Drug use: Never   Social History   Social History Narrative   He self acknowledges that he is out of shape.  Does not exercise.  He does have to walk quite a long ways from the parking lot to where his truck is parked for work, and sometimes loading off loading the truck can be difficult.    Family History family history includes Heart attack in his paternal grandfather; Heart attack (age of onset: 444 in his father; Prostate cancer in his father; Pulmonary embolism (age of onset: 640 in his father.   OBJCTIVE -PE, EKG, labs   Wt Readings from Last 3 Encounters:  06/06/19 (!) 376 lb 6.4 oz (170.7  kg)    Physical Exam: There were no vitals taken for this visit. Physical Exam    Adult ECG Report  Rate: *** ;  Rhythm: {rhythm:17366};   Narrative Interpretation: ***  Recent Labs:  ***  No results found for: CHOL, HDL, LDLCALC, LDLDIRECT, TRIG, CHOLHDL Lab Results  Component Value Date   CREATININE 0.85 06/04/2019   BUN 16 06/04/2019   NA 137 06/04/2019   K 4.0 06/04/2019   CL 101 06/04/2019   CO2 22 06/04/2019    ASSESSMENT/PLAN    Problem List Items Addressed This Visit    None        COVID-19 Education: The signs and symptoms of COVID-19 were discussed with the patient and how to seek care for testing (follow up with PCP or arrange E-visit).   The importance of social distancing was discussed today.  I spent a total of ***minutes with the patient and chart review. >  50% of the time was spent in direct patient consultation.  Additional time spent with chart review (studies, outside notes, etc): *** Total Time: *** min   Current medicines are reviewed at length with the patient today.  (+/- concerns) ***   Patient Instructions / Medication Changes & Studies & Tests Ordered   There are no Patient Instructions on file for this visit.   Studies Ordered:   No orders of the defined types were placed in this encounter.    Glenetta Hew, M.D., M.S. Interventional Cardiologist   Pager # [phone] Phone # [phone] [address] [place], [place] [address]   Thank you for choosing Heartcare at [facility]!!

## 2019-10-03 DIAGNOSIS — E7800 Pure hypercholesterolemia, unspecified: Secondary | ICD-10-CM | POA: Diagnosis not present

## 2019-10-03 DIAGNOSIS — E079 Disorder of thyroid, unspecified: Secondary | ICD-10-CM | POA: Diagnosis not present

## 2019-10-03 DIAGNOSIS — E039 Hypothyroidism, unspecified: Secondary | ICD-10-CM | POA: Diagnosis not present

## 2019-10-03 DIAGNOSIS — E119 Type 2 diabetes mellitus without complications: Secondary | ICD-10-CM | POA: Diagnosis not present

## 2019-10-03 DIAGNOSIS — E0789 Other specified disorders of thyroid: Secondary | ICD-10-CM | POA: Diagnosis not present

## 2019-10-03 DIAGNOSIS — Z139 Encounter for screening, unspecified: Secondary | ICD-10-CM | POA: Diagnosis not present

## 2019-10-03 DIAGNOSIS — E038 Other specified hypothyroidism: Secondary | ICD-10-CM | POA: Diagnosis not present

## 2019-11-08 DIAGNOSIS — E7800 Pure hypercholesterolemia, unspecified: Secondary | ICD-10-CM | POA: Diagnosis not present

## 2019-11-08 DIAGNOSIS — E119 Type 2 diabetes mellitus without complications: Secondary | ICD-10-CM | POA: Diagnosis not present

## 2019-11-08 DIAGNOSIS — I10 Essential (primary) hypertension: Secondary | ICD-10-CM | POA: Diagnosis not present

## 2019-11-08 DIAGNOSIS — N529 Male erectile dysfunction, unspecified: Secondary | ICD-10-CM | POA: Diagnosis not present

## 2020-10-14 LAB — HM DIABETES EYE EXAM

## 2020-11-05 DIAGNOSIS — M1711 Unilateral primary osteoarthritis, right knee: Secondary | ICD-10-CM | POA: Diagnosis not present

## 2021-06-05 ENCOUNTER — Encounter (HOSPITAL_BASED_OUTPATIENT_CLINIC_OR_DEPARTMENT_OTHER): Payer: Self-pay | Admitting: Obstetrics and Gynecology

## 2021-06-05 ENCOUNTER — Inpatient Hospital Stay (HOSPITAL_COMMUNITY): Payer: BC Managed Care – PPO | Admitting: Certified Registered Nurse Anesthetist

## 2021-06-05 ENCOUNTER — Emergency Department (HOSPITAL_BASED_OUTPATIENT_CLINIC_OR_DEPARTMENT_OTHER): Payer: BC Managed Care – PPO

## 2021-06-05 ENCOUNTER — Inpatient Hospital Stay (HOSPITAL_BASED_OUTPATIENT_CLINIC_OR_DEPARTMENT_OTHER)
Admission: EM | Admit: 2021-06-05 | Discharge: 2021-06-09 | DRG: 717 | Disposition: A | Discharge status: Home / Self Care | Payer: BC Managed Care – PPO | Attending: Internal Medicine | Admitting: Internal Medicine

## 2021-06-05 ENCOUNTER — Other Ambulatory Visit: Payer: Self-pay

## 2021-06-05 ENCOUNTER — Encounter (HOSPITAL_COMMUNITY)
Admission: EM | Disposition: A | Discharge status: Home / Self Care | Payer: Self-pay | Source: Home / Self Care | Attending: Family Medicine

## 2021-06-05 DIAGNOSIS — Z6841 Body Mass Index (BMI) 40.0 and over, adult: Secondary | ICD-10-CM | POA: Diagnosis not present

## 2021-06-05 DIAGNOSIS — F17220 Nicotine dependence, chewing tobacco, uncomplicated: Secondary | ICD-10-CM | POA: Diagnosis not present

## 2021-06-05 DIAGNOSIS — E785 Hyperlipidemia, unspecified: Secondary | ICD-10-CM | POA: Diagnosis not present

## 2021-06-05 DIAGNOSIS — Z9114 Patient's other noncompliance with medication regimen: Secondary | ICD-10-CM | POA: Diagnosis not present

## 2021-06-05 DIAGNOSIS — I10 Essential (primary) hypertension: Secondary | ICD-10-CM | POA: Diagnosis not present

## 2021-06-05 DIAGNOSIS — L02214 Cutaneous abscess of groin: Secondary | ICD-10-CM | POA: Diagnosis not present

## 2021-06-05 DIAGNOSIS — Z9989 Dependence on other enabling machines and devices: Secondary | ICD-10-CM | POA: Diagnosis not present

## 2021-06-05 DIAGNOSIS — Z20822 Contact with and (suspected) exposure to covid-19: Secondary | ICD-10-CM | POA: Diagnosis present

## 2021-06-05 DIAGNOSIS — E1169 Type 2 diabetes mellitus with other specified complication: Secondary | ICD-10-CM | POA: Diagnosis present

## 2021-06-05 DIAGNOSIS — N492 Inflammatory disorders of scrotum: Principal | ICD-10-CM | POA: Diagnosis present

## 2021-06-05 DIAGNOSIS — G4733 Obstructive sleep apnea (adult) (pediatric): Secondary | ICD-10-CM

## 2021-06-05 DIAGNOSIS — E1165 Type 2 diabetes mellitus with hyperglycemia: Secondary | ICD-10-CM | POA: Diagnosis present

## 2021-06-05 DIAGNOSIS — Z8249 Family history of ischemic heart disease and other diseases of the circulatory system: Secondary | ICD-10-CM | POA: Diagnosis not present

## 2021-06-05 DIAGNOSIS — Z28310 Unvaccinated for covid-19: Secondary | ICD-10-CM | POA: Diagnosis not present

## 2021-06-05 DIAGNOSIS — A4181 Sepsis due to Enterococcus: Secondary | ICD-10-CM | POA: Diagnosis not present

## 2021-06-05 DIAGNOSIS — N5089 Other specified disorders of the male genital organs: Secondary | ICD-10-CM | POA: Diagnosis not present

## 2021-06-05 DIAGNOSIS — N433 Hydrocele, unspecified: Secondary | ICD-10-CM | POA: Diagnosis not present

## 2021-06-05 DIAGNOSIS — N499 Inflammatory disorder of unspecified male genital organ: Secondary | ICD-10-CM | POA: Diagnosis not present

## 2021-06-05 DIAGNOSIS — E119 Type 2 diabetes mellitus without complications: Secondary | ICD-10-CM | POA: Diagnosis not present

## 2021-06-05 DIAGNOSIS — I2510 Atherosclerotic heart disease of native coronary artery without angina pectoris: Secondary | ICD-10-CM | POA: Diagnosis not present

## 2021-06-05 DIAGNOSIS — N493 Fournier gangrene: Secondary | ICD-10-CM

## 2021-06-05 HISTORY — PX: IRRIGATION AND DEBRIDEMENT ABSCESS: SHX5252

## 2021-06-05 LAB — URINALYSIS, ROUTINE W REFLEX MICROSCOPIC
Bilirubin Urine: NEGATIVE
Glucose, UA: >1000 mg/dL — AB
Hgb urine dipstick: NEGATIVE
Ketones, ur: NEGATIVE mg/dL
Leukocytes,Ua: NEGATIVE
Nitrite: NEGATIVE
Specific Gravity, Urine: >1.046 — ABNORMAL HIGH (ref 1.005–1.030)
pH: 5.5 (ref 5.0–8.0)

## 2021-06-05 LAB — CBC WITH DIFFERENTIAL/PLATELET
Abs Immature Granulocytes: 0.14 10*3/uL — ABNORMAL HIGH (ref 0.00–0.07)
Basophils Absolute: 0.1 10*3/uL (ref 0.0–0.1)
Basophils Relative: 1 %
Eosinophils Absolute: 0.1 10*3/uL (ref 0.0–0.5)
Eosinophils Relative: 1 %
HCT: 42.2 % (ref 39.0–52.0)
Hemoglobin: 14.1 g/dL (ref 13.0–17.0)
Immature Granulocytes: 1 %
Lymphocytes Relative: 15 %
Lymphs Abs: 2.4 10*3/uL (ref 0.7–4.0)
MCH: 29.4 pg (ref 26.0–34.0)
MCHC: 33.4 g/dL (ref 30.0–36.0)
MCV: 88.1 fL (ref 80.0–100.0)
Monocytes Absolute: 1.3 10*3/uL — ABNORMAL HIGH (ref 0.1–1.0)
Monocytes Relative: 8 %
Neutro Abs: 12.2 10*3/uL — ABNORMAL HIGH (ref 1.7–7.7)
Neutrophils Relative %: 74 %
Platelets: 227 10*3/uL (ref 150–400)
RBC: 4.79 MIL/uL (ref 4.22–5.81)
RDW: 12.2 % (ref 11.5–15.5)
WBC: 16.2 10*3/uL — ABNORMAL HIGH (ref 4.0–10.5)
nRBC: 0 % (ref 0.0–0.2)

## 2021-06-05 LAB — BASIC METABOLIC PANEL
Anion gap: 9 (ref 5–15)
BUN: 10 mg/dL (ref 6–20)
CO2: 26 mmol/L (ref 22–32)
Calcium: 8.8 mg/dL — ABNORMAL LOW (ref 8.9–10.3)
Chloride: 100 mmol/L (ref 98–111)
Creatinine, Ser: 0.75 mg/dL (ref 0.61–1.24)
GFR, Estimated: >60 mL/min (ref >60)
Glucose, Bld: 332 mg/dL — ABNORMAL HIGH (ref 70–99)
Potassium: 3.7 mmol/L (ref 3.5–5.1)
Sodium: 135 mmol/L (ref 135–145)

## 2021-06-05 LAB — HEPATIC FUNCTION PANEL
ALT: 19 U/L (ref 0–44)
AST: 13 U/L — ABNORMAL LOW (ref 15–41)
Albumin: 3.8 g/dL (ref 3.5–5.0)
Alkaline Phosphatase: 76 U/L (ref 38–126)
Bilirubin, Direct: 0.1 mg/dL (ref 0.0–0.2)
Indirect Bilirubin: 0.7 mg/dL (ref 0.3–0.9)
Total Bilirubin: 0.8 mg/dL (ref 0.3–1.2)
Total Protein: 6.9 g/dL (ref 6.5–8.1)

## 2021-06-05 LAB — RESP PANEL BY RT-PCR (FLU A&B, COVID) ARPGX2
Influenza A by PCR: NEGATIVE
Influenza B by PCR: NEGATIVE
SARS Coronavirus 2 by RT PCR: NEGATIVE

## 2021-06-05 LAB — GLUCOSE, CAPILLARY
Glucose-Capillary: 226 mg/dL — ABNORMAL HIGH (ref 70–99)
Glucose-Capillary: 248 mg/dL — ABNORMAL HIGH (ref 70–99)

## 2021-06-05 LAB — LACTIC ACID, PLASMA: Lactic Acid, Venous: 1.3 mmol/L (ref 0.5–1.9)

## 2021-06-05 LAB — PROCALCITONIN: Procalcitonin: <0.1 ng/mL

## 2021-06-05 SURGERY — IRRIGATION AND DEBRIDEMENT ABSCESS
Anesthesia: General | Site: Scrotum | Laterality: Right

## 2021-06-05 MED ORDER — AMISULPRIDE (ANTIEMETIC) 5 MG/2ML IV SOLN
10.0000 mg | Freq: Once | INTRAVENOUS | Status: DC | PRN
Start: 2021-06-05 — End: 2021-06-05

## 2021-06-05 MED ORDER — INSULIN ASPART 100 UNIT/ML IJ SOLN
4.0000 [IU] | Freq: Once | INTRAMUSCULAR | Status: AC
  Administered 2021-06-05: 4 [IU] via SUBCUTANEOUS

## 2021-06-05 MED ORDER — VANCOMYCIN HCL 2000 MG/400ML IV SOLN
2000.0000 mg | Freq: Three times a day (TID) | INTRAVENOUS | Status: DC
  Filled 2021-06-05: qty 400

## 2021-06-05 MED ORDER — FENTANYL CITRATE PF 50 MCG/ML IJ SOSY
50.0000 ug | PREFILLED_SYRINGE | INTRAMUSCULAR | Status: DC | PRN
  Administered 2021-06-07 – 2021-06-08 (×3): 50 ug via INTRAVENOUS
  Filled 2021-06-05 (×3): qty 1

## 2021-06-05 MED ORDER — FENTANYL CITRATE (PF) 250 MCG/5ML IJ SOLN
INTRAMUSCULAR | Status: AC
  Filled 2021-06-05: qty 5

## 2021-06-05 MED ORDER — PROPOFOL 10 MG/ML IV BOLUS
INTRAVENOUS | Status: AC
  Filled 2021-06-05: qty 20

## 2021-06-05 MED ORDER — FENTANYL CITRATE (PF) 100 MCG/2ML IJ SOLN
INTRAMUSCULAR | Status: DC | PRN
  Administered 2021-06-05 (×2): 50 ug via INTRAVENOUS
  Administered 2021-06-05: 100 ug via INTRAVENOUS

## 2021-06-05 MED ORDER — PIPERACILLIN-TAZOBACTAM 3.375 G IVPB
3.3750 g | Freq: Three times a day (TID) | INTRAVENOUS | Status: DC
  Administered 2021-06-05 – 2021-06-07 (×5): 3.375 g via INTRAVENOUS
  Filled 2021-06-05 (×5): qty 50

## 2021-06-05 MED ORDER — INSULIN GLARGINE-YFGN 100 UNIT/ML ~~LOC~~ SOLN
5.0000 [IU] | Freq: Every day | SUBCUTANEOUS | Status: DC
  Administered 2021-06-05: 5 [IU] via SUBCUTANEOUS
  Filled 2021-06-05 (×2): qty 0.05

## 2021-06-05 MED ORDER — ONDANSETRON HCL 4 MG/2ML IJ SOLN
INTRAMUSCULAR | Status: AC
  Filled 2021-06-05: qty 2

## 2021-06-05 MED ORDER — LIDOCAINE-EPINEPHRINE (PF) 2 %-1:200000 IJ SOLN
INTRAMUSCULAR | Status: AC
  Filled 2021-06-05: qty 20

## 2021-06-05 MED ORDER — FENTANYL CITRATE (PF) 100 MCG/2ML IJ SOLN
INTRAMUSCULAR | Status: AC
  Filled 2021-06-05: qty 2

## 2021-06-05 MED ORDER — VANCOMYCIN HCL IN DEXTROSE 1-5 GM/200ML-% IV SOLN
1000.0000 mg | Freq: Once | INTRAVENOUS | Status: AC
  Administered 2021-06-05: 1000 mg via INTRAVENOUS
  Filled 2021-06-05: qty 200

## 2021-06-05 MED ORDER — VANCOMYCIN HCL 1250 MG/250ML IV SOLN
1250.0000 mg | Freq: Three times a day (TID) | INTRAVENOUS | Status: DC
  Administered 2021-06-06 – 2021-06-07 (×5): 1250 mg via INTRAVENOUS
  Filled 2021-06-05 (×6): qty 250

## 2021-06-05 MED ORDER — MIDAZOLAM HCL 5 MG/5ML IJ SOLN
INTRAMUSCULAR | Status: DC | PRN
  Administered 2021-06-05: 2 mg via INTRAVENOUS

## 2021-06-05 MED ORDER — MORPHINE SULFATE (PF) 4 MG/ML IV SOLN
4.0000 mg | Freq: Once | INTRAVENOUS | Status: AC
  Administered 2021-06-05: 4 mg via INTRAVENOUS
  Filled 2021-06-05: qty 1

## 2021-06-05 MED ORDER — ONDANSETRON HCL 4 MG/2ML IJ SOLN
4.0000 mg | Freq: Once | INTRAMUSCULAR | Status: AC
  Administered 2021-06-05: 4 mg via INTRAVENOUS
  Filled 2021-06-05: qty 2

## 2021-06-05 MED ORDER — LIDOCAINE 2% (20 MG/ML) 5 ML SYRINGE
INTRAMUSCULAR | Status: DC | PRN
Start: 2021-06-05 — End: 2021-06-05
  Administered 2021-06-05: 60 mg via INTRAVENOUS

## 2021-06-05 MED ORDER — LIDOCAINE-EPINEPHRINE 2 %-1:100000 IJ SOLN: 30.0000 mL | Freq: Once | INTRAMUSCULAR | Status: DC

## 2021-06-05 MED ORDER — PIPERACILLIN-TAZOBACTAM 3.375 G IVPB: 3.3750 g | Freq: Three times a day (TID) | INTRAVENOUS | Status: DC

## 2021-06-05 MED ORDER — FENTANYL CITRATE PF 50 MCG/ML IJ SOSY: 25.0000 ug | PREFILLED_SYRINGE | INTRAMUSCULAR | Status: DC | PRN

## 2021-06-05 MED ORDER — ACETAMINOPHEN 325 MG PO TABS
650.0000 mg | ORAL_TABLET | Freq: Four times a day (QID) | ORAL | Status: DC | PRN
  Administered 2021-06-06 – 2021-06-07 (×2): 650 mg via ORAL
  Filled 2021-06-05 (×2): qty 2

## 2021-06-05 MED ORDER — SODIUM CHLORIDE 0.9 % IV BOLUS
500.0000 mL | Freq: Once | INTRAVENOUS | Status: AC
  Administered 2021-06-05: 500 mL via INTRAVENOUS

## 2021-06-05 MED ORDER — SODIUM CHLORIDE 0.9 % IV SOLN
75.0000 mL/h | INTRAVENOUS | Status: AC
  Administered 2021-06-05: 75 mL/h via INTRAVENOUS

## 2021-06-05 MED ORDER — CLINDAMYCIN PHOSPHATE 900 MG/50ML IV SOLN
900.0000 mg | Freq: Three times a day (TID) | INTRAVENOUS | Status: DC
  Administered 2021-06-06 – 2021-06-07 (×5): 900 mg via INTRAVENOUS
  Filled 2021-06-05 (×6): qty 50

## 2021-06-05 MED ORDER — DEXAMETHASONE SODIUM PHOSPHATE 10 MG/ML IJ SOLN
INTRAMUSCULAR | Status: AC
  Filled 2021-06-05: qty 1

## 2021-06-05 MED ORDER — FENTANYL CITRATE PF 50 MCG/ML IJ SOSY
PREFILLED_SYRINGE | INTRAMUSCULAR | Status: AC
  Filled 2021-06-05: qty 2

## 2021-06-05 MED ORDER — SUGAMMADEX SODIUM 500 MG/5ML IV SOLN
INTRAVENOUS | Status: DC | PRN
  Administered 2021-06-05: 500 mg via INTRAVENOUS

## 2021-06-05 MED ORDER — LACTATED RINGERS IV SOLN: INTRAVENOUS | Status: DC

## 2021-06-05 MED ORDER — ROCURONIUM BROMIDE 10 MG/ML (PF) SYRINGE
PREFILLED_SYRINGE | INTRAVENOUS | Status: DC | PRN
  Administered 2021-06-05: 50 mg via INTRAVENOUS

## 2021-06-05 MED ORDER — PROPOFOL 10 MG/ML IV BOLUS
INTRAVENOUS | Status: DC | PRN
  Administered 2021-06-05: 200 mg via INTRAVENOUS

## 2021-06-05 MED ORDER — VANCOMYCIN HCL 1250 MG/250ML IV SOLN
1250.0000 mg | Freq: Three times a day (TID) | INTRAVENOUS | Status: DC
Start: 2021-06-06 — End: 2021-06-05
  Filled 2021-06-05: qty 250

## 2021-06-05 MED ORDER — DEXAMETHASONE SODIUM PHOSPHATE 10 MG/ML IJ SOLN
INTRAMUSCULAR | Status: DC | PRN
Start: 2021-06-05 — End: 2021-06-05
  Administered 2021-06-05: 10 mg via INTRAVENOUS

## 2021-06-05 MED ORDER — MIDAZOLAM HCL 2 MG/2ML IJ SOLN
INTRAMUSCULAR | Status: AC
  Filled 2021-06-05: qty 2

## 2021-06-05 MED ORDER — INSULIN ASPART 100 UNIT/ML IJ SOLN
0.0000 [IU] | INTRAMUSCULAR | Status: DC
  Administered 2021-06-05: 3 [IU] via SUBCUTANEOUS
  Administered 2021-06-06 (×2): 5 [IU] via SUBCUTANEOUS
  Administered 2021-06-06: 9 [IU] via SUBCUTANEOUS
  Administered 2021-06-06: 5 [IU] via SUBCUTANEOUS
  Filled 2021-06-05: qty 0.09

## 2021-06-05 MED ORDER — VANCOMYCIN HCL 2000 MG/400ML IV SOLN: 2000.0000 mg | Freq: Once | INTRAVENOUS | Status: DC

## 2021-06-05 MED ORDER — SUGAMMADEX SODIUM 500 MG/5ML IV SOLN
INTRAVENOUS | Status: AC
  Filled 2021-06-05: qty 5

## 2021-06-05 MED ORDER — CLINDAMYCIN PHOSPHATE 600 MG/50ML IV SOLN
600.0000 mg | Freq: Three times a day (TID) | INTRAVENOUS | Status: DC
  Administered 2021-06-05: 600 mg via INTRAVENOUS
  Filled 2021-06-05: qty 50

## 2021-06-05 MED ORDER — ONDANSETRON HCL 4 MG/2ML IJ SOLN
INTRAMUSCULAR | Status: DC | PRN
  Administered 2021-06-05: 4 mg via INTRAVENOUS

## 2021-06-05 MED ORDER — PIPERACILLIN-TAZOBACTAM 3.375 G IVPB 30 MIN
3.3750 g | Freq: Once | INTRAVENOUS | Status: AC
Start: 2021-06-05 — End: 2021-06-05
  Administered 2021-06-05: 3.375 g via INTRAVENOUS
  Filled 2021-06-05: qty 50

## 2021-06-05 MED ORDER — IOHEXOL 350 MG/ML SOLN
100.0000 mL | Freq: Once | INTRAVENOUS | Status: AC | PRN
  Administered 2021-06-05: 100 mL via INTRAVENOUS

## 2021-06-05 MED ORDER — ACETAMINOPHEN 650 MG RE SUPP: 650.0000 mg | Freq: Four times a day (QID) | RECTAL | Status: DC | PRN

## 2021-06-05 MED ORDER — SUCCINYLCHOLINE CHLORIDE 200 MG/10ML IV SOSY
PREFILLED_SYRINGE | INTRAVENOUS | Status: DC | PRN
  Administered 2021-06-05: 140 mg via INTRAVENOUS

## 2021-06-05 MED ORDER — INSULIN ASPART 100 UNIT/ML IJ SOLN
INTRAMUSCULAR | Status: AC
  Filled 2021-06-05: qty 1

## 2021-06-05 SURGICAL SUPPLY — 40 items
APL SKNCLS STERI-STRIP NONHPOA (GAUZE/BANDAGES/DRESSINGS)
BAG COUNTER SPONGE SURGICOUNT (BAG) IMPLANT
BAG SPNG CNTER NS LX DISP (BAG)
BENZOIN TINCTURE PRP APPL 2/3 (GAUZE/BANDAGES/DRESSINGS) IMPLANT
BLADE HEX COATED 2.75 (ELECTRODE) ×4 IMPLANT
BLADE SURG 15 STRL LF DISP TIS (BLADE) ×2 IMPLANT
BLADE SURG 15 STRL SS (BLADE) ×4
BLADE SURG SZ10 CARB STEEL (BLADE) ×4 IMPLANT
BNDG GAUZE ELAST 4 BULKY (GAUZE/BANDAGES/DRESSINGS) ×1 IMPLANT
COVER SURGICAL LIGHT HANDLE (MISCELLANEOUS) ×2 IMPLANT
DECANTER SPIKE VIAL GLASS SM (MISCELLANEOUS) IMPLANT
DRAPE LAPAROSCOPIC ABDOMINAL (DRAPES) IMPLANT
DRAPE LAPAROTOMY T 102X78X121 (DRAPES) IMPLANT
DRAPE LAPAROTOMY TRNSV 102X78 (DRAPES) IMPLANT
DRAPE POUCH INSTRU U-SHP 10X18 (DRAPES) ×2 IMPLANT
DRAPE SHEET LG 3/4 BI-LAMINATE (DRAPES) IMPLANT
DRSG PAD ABDOMINAL 8X10 ST (GAUZE/BANDAGES/DRESSINGS) ×1 IMPLANT
ELECT REM PT RETURN 15FT ADLT (MISCELLANEOUS) ×4 IMPLANT
EVACUATOR SILICONE 100CC (DRAIN) IMPLANT
GAUZE SPONGE 4X4 12PLY STRL (GAUZE/BANDAGES/DRESSINGS) ×4 IMPLANT
GLOVE SURG UNDER POLY LF SZ7 (GLOVE) ×2 IMPLANT
GOWN STRL REUS W/TWL LRG LVL3 (GOWN DISPOSABLE) ×2 IMPLANT
GOWN STRL REUS W/TWL XL LVL3 (GOWN DISPOSABLE) ×4 IMPLANT
KIT BASIN OR (CUSTOM PROCEDURE TRAY) ×4 IMPLANT
KIT TURNOVER KIT A (KITS) ×2 IMPLANT
NDL HYPO 25X1 1.5 SAFETY (NEEDLE) ×2 IMPLANT
NEEDLE HYPO 25X1 1.5 SAFETY (NEEDLE) ×4 IMPLANT
NS IRRIG 1000ML POUR BTL (IV SOLUTION) ×4 IMPLANT
PACK BASIC VI WITH GOWN DISP (CUSTOM PROCEDURE TRAY) ×4 IMPLANT
PENCIL SMOKE EVACUATOR (MISCELLANEOUS) IMPLANT
SOL PREP POV-IOD 4OZ 10% (MISCELLANEOUS) ×2 IMPLANT
SPONGE T-LAP 18X18 ~~LOC~~+RFID (SPONGE) ×2 IMPLANT
SPONGE T-LAP 4X18 ~~LOC~~+RFID (SPONGE) ×4 IMPLANT
STAPLER VISISTAT 35W (STAPLE) IMPLANT
STRIP CLOSURE SKIN 1/2X4 (GAUZE/BANDAGES/DRESSINGS) IMPLANT
SUT MNCRL AB 4-0 PS2 18 (SUTURE) IMPLANT
SUT VIC AB 3-0 SH 18 (SUTURE) IMPLANT
SYR CONTROL 10ML LL (SYRINGE) ×4 IMPLANT
TOWEL OR 17X26 10 PK STRL BLUE (TOWEL DISPOSABLE) ×2 IMPLANT
WATER STERILE IRR 1000ML POUR (IV SOLUTION) ×2 IMPLANT

## 2021-06-05 NOTE — Progress Notes (Signed)
Pharmacy Antibiotic Note  Robert Lewis is a 55 y.o. male admitted on 06/05/2021 with possible Fournier's gangrene.  Pharmacy has been consulted for zosyn and vancomycin dosing.  WBC 16.2, afebrile SCr 0.75 - baseline  Plan: Clindamycin per MD Zosyn 3.375 gm every 8 hours  Give vancomycin 2000 mg IV x1 Vancomycin 2000 mg IV every 8 hours.  Goal trough 10-15 mcg/mL.    > eAUC 497, SCr used 0.8, Cmax 35.3, Vd 0.5, AdjBW used Monitor renal function, CBC, cultures/sensitivities and de-escalate as able  Temp (24hrs), Avg:98.6 F (37 C), Min:98.6 F (37 C), Max:98.6 F (37 C)  Recent Labs  Lab 06/05/21 1431  WBC 16.2*  CREATININE 0.75  LATICACIDVEN 1.3    Estimated Creatinine Clearance: 173.1 mL/min (by C-G formula based on SCr of 0.75 mg/dL).    No Known Allergies  Antimicrobials this admission: Vancomycin 9/14 >> Zosyn 9/14 >> Clindamycin 9/14 >>  Dose adjustments this admission: none  Microbiology results: 9/14 BCx: pending  Thank you for allowing pharmacy to be a part of this patient's care.  Filbert Schilder, PharmD PGY1 Pharmacy Resident 06/05/2021  4:34 PM  Please check [email or web address] for unit-specific pharmacy phone numbers.

## 2021-06-05 NOTE — ED Notes (Signed)
[facility] arrived for transport 

## 2021-06-05 NOTE — Op Note (Signed)
Preoperative diagnosis: Scrotal/inguinal abscess  Postoperative diagnosis: Scrotal/inguinal abscess  Procedure: Incision and drainage of scrotal/inguinal abscess (3 x 4 cm)  Surgeon: Moody Bruins MD  Anesthesia: General  Complications: None  EBL: Minimal  Specimens: Aerobic and anaerobic cultures from scrotal/inguinal abscess  Disposition of specimen: Microbiology lab  Indication: Robert Lewis is a 55 year old gentleman who presented with increasing right hemiscrotum/inguinal pain and swelling.  A CT scan of the pelvis demonstrated significant stranding along with gas in the right hemiscrotum and inguinal region consistent with abscess formation.  He was hemodynamically stable and did not have evidence of gangrenous infection.  He was started on broad-spectrum IV antibiotics.  He is a diabetic and has not been taking medication for over a year.  He is to be admitted to the internal medicine service for further treatment of his uncontrolled diabetes and supportive care.  He was recommended to undergo incision and drainage of his apparent abscess.  The potential risks, complications, and expected recovery process was discussed in detail.  Informed consent was obtained.  Description of procedure: The patient was taken to the operating room and a general anesthetic was administered.  He had already received broad-spectrum IV antibiotics.  He was placed in the dorsolithotomy position, prepped and draped in the usual sterile fashion, and a preoperative timeout was performed.  The indurated area over the junction of the right hemiscrotum and inguinal region was palpated.  No clear fluctuant area was identified.  This area was incised approximately 2 inches and immediately, purulent drainage was noted.  This was cultured for anaerobic and aerobic bacteria.  This was then opened further with finger dissection up into the right inguinal region as well as into the right hemiscrotum.  Once it was  felt that this was adequately drained, the wound was copiously irrigated with saline.  Hemostasis was achieved with electrocautery.  A Curlex wet-to-dry dressing was then placed and the procedure was ended.  He tolerated the procedure well without complications.

## 2021-06-05 NOTE — Anesthesia Preprocedure Evaluation (Signed)
Anesthesia Evaluation  Patient identified by MRN, date of birth, ID band Patient awake    Reviewed: Allergy & Precautions, NPO status , Patient's Chart, lab work & pertinent test results  Airway Mallampati: III  TM Distance: >3 FB Neck ROM: Full    Dental  (+) Dental Advisory Given   Pulmonary sleep apnea ,    breath sounds clear to auscultation       Cardiovascular hypertension, Pt. on medications + CAD   Rhythm:Regular Rate:Normal     Neuro/Psych negative neurological ROS     GI/Hepatic negative GI ROS, Neg liver ROS,   Endo/Other  diabetes, Type obesity  Renal/GU negative Renal ROS     Musculoskeletal   Abdominal   Peds  Hematology negative hematology ROS (+)   Anesthesia Other Findings   Reproductive/Obstetrics                             Anesthesia Physical Anesthesia Plan  ASA: 3 and emergent  Anesthesia Plan: General   Post-op Pain Management:    Induction: Intravenous and Rapid sequence  PONV Risk Score and Plan: 2 and Dexamethasone, Ondansetron and Treatment may vary due to age or medical condition  Airway Management Planned: Oral ETT  Additional Equipment: None  Intra-op Plan:   Post-operative Plan: Extubation in OR  Informed Consent: I have reviewed the patients History and Physical, chart, labs and discussed the procedure including the risks, benefits and alternatives for the proposed anesthesia with the patient or authorized representative who has indicated his/her understanding and acceptance.     Dental advisory given  Plan Discussed with: CRNA  Anesthesia Plan Comments:         Anesthesia Quick Evaluation

## 2021-06-05 NOTE — Progress Notes (Addendum)
Pharmacy Antibiotic Note  Robert Lewis is a 55 y.o. male presented to the ED on 06/05/2021 with c/o right sided inguinal area/scrotum pain and swelling. CT of pelvis showed  findings consistent with right scrotal/groin cellulitis and "small locules of gas in the right groin; infection with a gas-forming organism/Fournier's gangrene cannot be excluded".  Pharmacy has been consulted to dose vancomycin and zosyn for infection.  Plan is for I&D on 9/14.   Plan: - continue zosyn 3.375 gm IV q8h (infuse over 4 hrs) - vancomycin 2000 mg total given at [facility] at ~6p. Will adjust maintenance vancomycin dose to 1250mg  IV q8h for est AUC 428 - ok with Dr. to adjust clindamycin to 900mg  IV q8h x9 doses for toxin production suppression    _______________________________________ Height: 6\' 2"  (188 cm) Weight: (!) 163.3 kg (360 lb) IBW/kg (Calculated) : 82.2  Temp (24hrs), Avg:99 F (37.2 C), Min:98.4 F (36.9 C), Max:99.9 F (37.7 C)  Recent Labs  Lab 06/05/21 1431  WBC 16.2*  CREATININE 0.75  LATICACIDVEN 1.3    Estimated Creatinine Clearance: 173.1 mL/min (by C-G formula based on SCr of 0.75 mg/dL).    No Known Allergies   Thank you for allowing pharmacy to be a part of this patient's care.  06/05/2021 7:32 PM

## 2021-06-05 NOTE — Progress Notes (Signed)
CPAP is in room. Pt was not ready to wear. Pt made aware that they can call whenever they feel ready to be placed on CPAP

## 2021-06-05 NOTE — ED Notes (Signed)
Notified [facility] for transport to [facility] ED.

## 2021-06-05 NOTE — ED Provider Notes (Addendum)
Patient transferred from [facility] for surgical evaluation.  Here for scrotal redness, warmth.  CT scan showed concern for Fournier's gangrene versus scrotal abscess/ cellulitis.  Urology, Dr. Laverle Patter was consulted who recommended transfer for admission and surgical evaluation in ED  Last PO intake 11AM  Physical Exam  BP (!) 155/94   Pulse 98   Temp 99.9 F (37.7 C) (Oral)   Resp 16   Ht 6\' 2"  (1.88 m)   Wt (!) 163.3 kg   SpO2 96%   BMI 46.22 kg/m   Physical Exam Vitals and nursing note reviewed.  Constitutional:      General: He is not in acute distress.    Appearance: He is well-developed. He is not ill-appearing, toxic-appearing or diaphoretic.  HENT:     Head: Atraumatic.  Eyes:     Pupils: Pupils are equal, round, and reactive to light.  Cardiovascular:     Rate and Rhythm: Normal rate and regular rhythm.  Pulmonary:     Effort: Pulmonary effort is normal. No respiratory distress.  Abdominal:     General: There is no distension.     Palpations: Abdomen is soft.  Musculoskeletal:        General: Normal range of motion.     Cervical back: Normal range of motion and neck supple.  Skin:    General: Skin is warm and dry.  Neurological:     General: No focal deficit present.     Mental Status: He is alert and oriented to person, place, and time.    ED Course/Procedures   Clinical Course as of 06/05/21 1924  Wed Jun 05, 2021  1546 I spoke with radiology, concern for forniers.  [EH]  1559 Text paged on call urology [EH]  [phone] I spoke with Dr. [id number] of urology.  He requested that I ED to ED transfer patient for him to evaluate.  If indicated he will contact surgery. [EH]  1639 I spoke with Dr. [id number], ED at [facility] long who accepts patient for transfer. [EH]    Clinical Course User Index [EH] [facility], PA-C    Procedures Labs Reviewed  BASIC METABOLIC PANEL - Abnormal; Notable for the following components:      Result Value   Glucose, Bld 332 (*)     Calcium 8.8 (*)    All other components within normal limits  CBC WITH DIFFERENTIAL/PLATELET - Abnormal; Notable for the following components:   WBC 16.2 (*)    Neutro Abs 12.2 (*)    Monocytes Absolute 1.3 (*)    Abs Immature Granulocytes 0.14 (*)    All other components within normal limits  URINALYSIS, ROUTINE W REFLEX MICROSCOPIC - Abnormal; Notable for the following components:   Specific Gravity, Urine >1.046 (*)    Glucose, UA >1,000 (*)    Protein, ur TRACE (*)    Bacteria, UA RARE (*)    All other components within normal limits  RESP PANEL BY RT-PCR (FLU A&B, COVID) ARPGX2  CULTURE, BLOOD (ROUTINE X 2)  CULTURE, BLOOD (ROUTINE X 2)  LACTIC ACID, PLASMA   CT PELVIS W CONTRAST  Result Date: 06/05/2021 CLINICAL DATA:  Hernia, right scrotal/inguinal abscess EXAM: CT PELVIS WITH CONTRAST TECHNIQUE: Multidetector CT imaging of the pelvis was performed using the standard protocol following the bolus administration of intravenous contrast. CONTRAST:  06/07/2021 OMNIPAQUE IOHEXOL 350 MG/ML SOLN COMPARISON:  None. FINDINGS: Urinary Tract: The bladder lumen measures greater than simple fluid attenuation. No focal lesions are identified. Bowel:  The imaged bowel is unremarkable. Vascular/Lymphatic: The imaged vasculature is unremarkable. There are a few prominent bilateral inguinal lymph nodes. There is no pathologic lymphadenopathy in the pelvis. Reproductive:  The prostate and seminal vesicles are unremarkable. Other: There is significant inflammatory change in the right groin extending into the scrotum. There are bilateral hydroceles. Musculoskeletal: Left femur hardware is partially imaged. There is no acute osseous abnormality in the pelvis. IMPRESSION: 1. Findings above consistent with right scrotal/groin cellulitis with bilateral hydroceles. There are small locules of gas in the right groin; infection with a gas-forming organism/Fournier's gangrene cannot be excluded. 2. The bladder lumen  measures greater than simple fluid attenuation. This may reflect blood or contrast from a prior imaging study. Correlate with urinalysis as indicated. These results were called by telephone at the time of interpretation on 06/05/2021 at 3:48 pm to provider [facility] , who verbally acknowledged these results. Electronically Signed   By: Lesia Hausen M.D.   On: 06/05/2021 15:49    MDM  CONSULT with Dr. Laverle Patter, will see in ED.  Recommending starting clindamycin, admit to medicine. General surgery possibly needs to be involved however he will assess patient at bedside and determine this and contact if needed  CONSULT with Dr. Adela Glimpse with [facility] who will admit for further management.  The patient appears reasonably stabilized for admission considering the current resources, flow, and capabilities available in the ED at this time, and I doubt any other [facility] requiring further screening and/or treatment in the ED prior to admission.            Linwood Dibbles, PA-C 06/05/21 1924    Vanetta Mulders, MD 06/14/21 [phone]

## 2021-06-05 NOTE — Plan of Care (Signed)
55 y/o male presented to ED with c/o scrotal pain. Found to have a right side inguinal/scrotal abscess. S/p I&D of right inguinal abscess. Denies c/o pain or discomfort at present time. Discussed POC with patient and spouse at bedside.

## 2021-06-05 NOTE — ED Notes (Signed)
Patient in radiology and then went to bathroom, will update vitals when patient returns.

## 2021-06-05 NOTE — H&P (Signed)
Robert Lewis TIW:[id number] DOB: [birth year] DOA: 06/05/2021     PCP: Suzan Garibaldi, FNP   Outpatient Specialists: NONE    Patient arrived to ER on 06/05/21 at 1228 Referred by Attending Fredia Sorrow, MD   Patient coming from: home Lives With family    Chief Complaint: scrotal swelling and pain  Chief Complaint  Patient presents with   Abscess    HPI: Robert Lewis is a 55 y.o. 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  He is diabetic but has not been taking his medicines for past year or so.  His blood sugar runs in 300s.  Have not had any fevers    Has  NOt been vaccinated against COVID     Initial COVID TEST  NEGATIVE   Lab Results  Component Value Date   [place] 06/05/2021   [place] NEGATIVE 06/04/2019    Regarding pertinent Chronic problems:   Hyperlipidemia -  not on statins Lipid Panel    HTN not on any meds   DM 2 - stopped taking his meds    OSA -on  CPAP,    While in ER: Imaging was concerning for Fournier's gangrene Page to urology Discussed with Dr. Alinda Money plan to evaluate in the emergency department transferred to [facility].  Started on vancomycin and clindamycin and Zosyn given for morphine     ED Triage Vitals  Enc Vitals Group     BP 06/05/21 1236 (!) 152/100     Pulse Rate 06/05/21 1236 (!) 106     Resp 06/05/21 1236 (!) 22     Temp 06/05/21 1236 98.6 F (37 C)     Temp Source 06/05/21 1731 Oral     SpO2 06/05/21 1236 97 %     Weight 06/05/21 1237 (!) 360 lb (163.3 kg)     Height 06/05/21 1237 _0  (1.88 m)     Head Circumference --      Peak Flow --      Pain Score 06/05/21 1237 3     Pain Loc --      Pain Edu? --      Excl. in [place]? --   TMAX(24)@     _________________________________________ Significant  initial  Findings: Abnormal Labs Reviewed  BASIC METABOLIC PANEL - Abnormal; Notable for the following components:      Result Value   Glucose, Bld 332 (*)    Calcium 8.8 (*)    All other components within normal limits  CBC WITH DIFFERENTIAL/PLATELET - Abnormal; Notable for the following components:   WBC 16.2 (*)    Neutro Abs 12.2 (*)    Monocytes Absolute 1.3 (*)    Abs Immature Granulocytes 0.14 (*)    All other components within normal limits  URINALYSIS, ROUTINE W REFLEX MICROSCOPIC - Abnormal; Notable for the following components:   Specific Gravity, Urine >1.046 (*)    Glucose, UA >1,000 (*)    Protein, ur TRACE (*)    Bacteria, UA RARE (*)    All other components within normal limits   ____________________________________________ Ordered    CTabd/pelvis - right scrotal/groin cellulitis with bilateral hydroceles. small locules of gas in the right groin; infection with a gas-forming organism/Fournier's gangrene cannot be excluded  ______________________  ECG: Ordered    The recent clinical data is shown below. Vitals:   06/05/21 1700 06/05/21 1715 06/05/21 1731 06/05/21 1840  BP: 140/75 139/75  (!) 155/94  Pulse: 87 91  98  Resp:    16  Temp:   98.4 F (36.9 C) 99.9 F (37.7 C)  TempSrc:   Oral Oral  SpO2: 99% 98%  96%  Weight:      Height:        WBC     Component Value Date/Time   WBC 16.2 (H) 06/05/2021 1431   LYMPHSABS 2.4 06/05/2021 1431   MONOABS 1.3 (H) 06/05/2021 1431   EOSABS 0.1 06/05/2021 1431   BASOSABS 0.1 06/05/2021 1431    Lactic Acid, Venous    Component Value Date/Time   LATICACIDVEN 1.3 06/05/2021 1431      UA  no evidence of UTI    Urine analysis:    Component Value Date/Time   COLORURINE YELLOW 06/05/2021 1431   APPEARANCEUR CLEAR 06/05/2021 1431   LABSPEC >1.046 (H) 06/05/2021 1431   PHURINE 5.5 06/05/2021 1431   GLUCOSEU >1,000 (A) 06/05/2021 1431   HGBUR NEGATIVE 06/05/2021 1431   BILIRUBINUR NEGATIVE 06/05/2021  1431   KETONESUR NEGATIVE 06/05/2021 1431   PROTEINUR TRACE (A) 06/05/2021 1431   NITRITE NEGATIVE 06/05/2021 1431   LEUKOCYTESUR NEGATIVE 06/05/2021 1431    Results for orders placed or performed during the hospital encounter of 06/05/21  Resp Panel by RT-PCR (Flu A&B, Covid) Nasopharyngeal Swab     Status: None   Collection Time: 06/05/21  4:03 PM   Specimen: Nasopharyngeal Swab; Nasopharyngeal(NP) swabs in vial transport medium  Result Value Ref Range Status   SARS Coronavirus 2 by RT PCR NEGATIVE NEGATIVE Final         Influenza A by PCR NEGATIVE NEGATIVE Final   Influenza B by PCR NEGATIVE NEGATIVE Final          _______________________________________________________ ER Provider Called:   Urology  Dr. Alinda Money They Recommend admit to medicine    SEEN in ER _______________________________________________ Hospitalist was called for admission for  fournier gangreen  The following Work up has been ordered so far:  Orders Placed This Encounter  Procedures   Critical Care   Resp Panel by RT-PCR (Flu A&B, Covid) Nasopharyngeal Swab   Culture, blood (routine x 2)   CT PELVIS W CONTRAST   Basic metabolic panel   CBC with Differential   Urinalysis, Routine w reflex microscopic   Diet NPO time specified   Cardiac monitoring   DO NOT delay antibiotics if unable to obtain blood culture.   Informed Consent Details: Physician/Practitioner Attestation; Transcribe to consent form and obtain patient signature   Consult to urology   pharmacy consult   vancomycin per pharmacy consult   piperacillin-tazobactam (ZOSYN) per pharmacy consult   Consult to hospitalist   Saline lock IV   Insert 2nd peripheral IV if not already present.    Following Medications were ordered in ER: Medications  lidocaine-EPINEPHrine (XYLOCAINE W/EPI) 2 %-1:100000 (with pres) injection 30 mL (30 mLs Infiltration Not Given 06/05/21 1613)  lidocaine-EPINEPHrine (XYLOCAINE W/EPI) 2 %-1:200000 (PF) injection (   Not Given 06/05/21 1613)  lactated ringers infusion (has no administration in time range)  clindamycin (CLEOCIN) IVPB 600 mg (600 mg Intravenous New Bag/Given 06/05/21 1841)  vancomycin (VANCOCIN) IVPB 1000 mg/200 mL premix (1,000 mg Intravenous Not Given 06/05/21 1822)  vancomycin (VANCOREADY) IVPB 2000 mg/400 mL (has no administration in time range)  piperacillin-tazobactam (ZOSYN)  IVPB 3.375 g (has no administration in time range)  iohexol (OMNIPAQUE) 350 MG/ML injection 100 mL (100 mLs Intravenous Contrast Given 06/05/21 1527)  morphine 4 MG/ML injection 4 mg (4 mg Intravenous Given 06/05/21 1552)  ondansetron (ZOFRAN) injection 4 mg (4 mg Intravenous Given 06/05/21 1548)  sodium chloride 0.9 % bolus 500 mL (0 mLs Intravenous Stopped 06/05/21 1813)  vancomycin (VANCOCIN) IVPB 1000 mg/200 mL premix (0 mg Intravenous Stopped 06/05/21 1842)  piperacillin-tazobactam (ZOSYN) IVPB 3.375 g (0 g Intravenous Stopped 06/05/21 1706)        Consult Orders  (From admission, onward)           Start     Ordered   06/05/21 1817  Consult to hospitalist  Once       Provider:  (Not yet assigned)  Question Answer Comment  Place call to: [facility]   Reason for Consult Admit      06/05/21 1816              OTHER Significant initial  Findings:  labs showing:    Recent Labs  Lab 06/05/21 1431  NA 135  K 3.7  CO2 26  GLUCOSE 332*  BUN 10  CREATININE 0.75  CALCIUM 8.8*    Cr   stable,    Lab Results  Component Value Date   CREATININE 0.75 06/05/2021   CREATININE 0.85 06/04/2019    No results for input(s): AST, ALT, ALKPHOS, BILITOT, PROT, ALBUMIN in the last 168 hours. Lab Results  Component Value Date   CALCIUM 8.8 (L) 06/05/2021    Plt: Lab Results  Component Value Date   PLT 227 06/05/2021    COVID-19 Labs  No results for input(s): DDIMER, FERRITIN, LDH, CRP in the last 72 hours.  Lab Results  Component Value Date   SARSCOV2NAA NEGATIVE 06/05/2021    [place] NEGATIVE 06/04/2019       Recent Labs  Lab 06/05/21 1431  WBC 16.2*  NEUTROABS 12.2*  HGB 14.1  HCT 42.2  MCV 88.1  PLT 227    HG/HCT  stable,       Component Value Date/Time   HGB 14.1 06/05/2021 1431   HCT 42.2 06/05/2021 1431   MCV 88.1 06/05/2021 1431    DM  labs:  HbA1C: No results for input(s): HGBA1C in the last 8760 hours.     CBG (last 3)  Recent Labs    06/05/21 2029  GLUCAP 226*          Cultures: No results found for: SDES, SPECREQUEST, CULT, REPTSTATUS   Radiological Exams on Admission: CT PELVIS W CONTRAST  Result Date: 06/05/2021 CLINICAL DATA:  Hernia, right scrotal/inguinal abscess EXAM: CT PELVIS WITH CONTRAST TECHNIQUE: Multidetector CT imaging of the pelvis was performed using the standard protocol following the bolus administration of intravenous contrast. CONTRAST:  161m OMNIPAQUE IOHEXOL 350 MG/ML SOLN COMPARISON:  None. FINDINGS: Urinary Tract: The bladder lumen measures greater than simple fluid attenuation. No focal lesions are identified. Bowel:  The imaged bowel is unremarkable. Vascular/Lymphatic: The imaged vasculature is unremarkable. There are a few prominent bilateral inguinal lymph nodes. There is no pathologic lymphadenopathy in the pelvis. Reproductive:  The prostate and seminal vesicles are unremarkable. Other: There is significant inflammatory change in the right groin extending into the scrotum. There are bilateral hydroceles. Musculoskeletal: Left femur hardware is partially imaged. There is no acute osseous abnormality in the pelvis. IMPRESSION: 1. Findings above consistent with right scrotal/groin cellulitis with bilateral hydroceles. There are small  locules of gas in the right groin; infection with a gas-forming organism/Fournier's gangrene cannot be excluded. 2. The bladder lumen measures greater than simple fluid attenuation. This may reflect blood or contrast from a prior imaging study. Correlate with urinalysis as  indicated. These results were called by telephone at the time of interpretation on 06/05/2021 at 3:48 pm to provider [facility] , who verbally acknowledged these results. Electronically Signed   By: Valetta Mole M.D.   On: 06/05/2021 15:49   _______________________________________________________________________________________________________ Latest  Blood pressure (!) 155/94, pulse 98, temperature 99.9 F (37.7 C), temperature source Oral, resp. rate 16, height _0  (1.88 m), weight (!) 163.3 kg, SpO2 96 %.   Review of Systems:    Pertinent positives include:    fatigue, Constitutional:  No weight loss, night sweats, Fevers, chills,  weight loss  HEENT:  No headaches, Difficulty swallowing,Tooth/dental problems,Sore throat,  No sneezing, itching, ear ache, nasal congestion, post nasal drip,  Cardio-vascular:  No chest pain, Orthopnea, PND, anasarca, dizziness, palpitations.no Bilateral lower extremity swelling  GI:  No heartburn, indigestion, abdominal pain, nausea, vomiting, diarrhea, change in bowel habits, loss of appetite, melena, blood in stool, hematemesis Resp:  no shortness of breath at rest. No dyspnea on exertion, No excess mucus, no productive cough, No non-productive cough, No coughing up of blood.No change in color of mucus.No wheezing. Skin:  no rash or lesions. No jaundice GU:  no dysuria, change in color of urine, no urgency or frequency. No straining to urinate.  No flank pain.  Musculoskeletal:  No joint pain or no joint swelling. No decreased range of motion. No back pain.  Psych:  No change in mood or affect. No depression or anxiety. No memory loss.  Neuro: no localizing neurological complaints, no tingling, no weakness, no double vision, no gait abnormality, no slurred speech, no confusion  All systems reviewed and apart from [place] all are negative _______________________________________________________________________________________________ Past  Medical History:   Past Medical History:  Diagnosis Date   [place] coronary artery calcium score between 200 and 399 05/2019   Controlled diabetes mellitus type II without complication ([place])    Not on insulin   Hyperlipidemia associated with type 2 diabetes mellitus ([place])    Hypertension    Morbid obesity with BMI of 45.0-49.9, adult (HCC)    OSA on CPAP      Past Surgical History:  Procedure Laterality Date   APPENDECTOMY     FEMUR FRACTURE SURGERY Left    ORIF with pin in place   TRANSTHORACIC ECHOCARDIOGRAM  05/2019   EF 60-65%.  Normal wall motion.  Mild LVH.  Normal RV size and function.  Normal atria.  Mild MAC.  Mild aortic sclerosis with no stenosis.--Essentially normal.    Social History:  Ambulatory  independently        reports that he has never smoked. His smokeless tobacco use includes chew. He reports that he does not use drugs. No history on file for alcohol use.       Family History:   Family History  Problem Relation Age of Onset   Prostate cancer Father    Heart attack Father 25   Pulmonary embolism Father 51       Cause of death   Heart attack Paternal Grandfather        Presumably in his 39s   ___ _____________________________________________ Allergies: No Known Allergies   Prior to Admission medications   Not on File    ___________________________________________________________________________________________________ Physical Exam: Vitals  with BMI 06/05/2021 06/05/2021 06/05/2021  Height - - -  Weight - - -  BMI - - -  Systolic 185 631 497  Diastolic 94 75 75  Pulse 98 91 87    1. General:  in No  Acute distress   Chronically ill -appearing 2. Psychological: Alert and  Oriented 3. Head/ENT:    Dry Mucous Membranes                          Head Non traumatic, neck supple                          Normal   Dentition 4. SKIN:  decreased Skin turgor,  Skin clean Dry red swollen scrotum with induration of right groin 5. Heart: Regular rate  and rhythm no  Murmur, no Rub or gallop 6. Lungs:  Clear to auscultation bilaterally, no wheezes or crackles   7. Abdomen: Soft,  non-tender, Non distended   obese   8. Lower extremities: no clubbing, cyanosis, no  edema 9. Neurologically Grossly intact, moving all 4 extremities equally   10. MSK: Normal range of motion    Chart has been reviewed  ______________________________________________________________________________________________  Assessment/Plan 55 y.o. male with medical history significant of  Dm2, HTN, , OSA on CPAP, obesity with a BMI of 46   Admitted for possible Fournier gangrene  Present on Admission:  Fournier gangrene admitted for broad-spectrum IV antibiotics vein clindamycin and Zosyn was started patient was taken emergently by urology for debridement Appreciate urology consult   Morbid obesity ([place]) -chronic stable need to follow-up as an Patient  Essential hypertension -not taking any home medications allow permissive hypertension  OSA -resume CPAP when able to tolerate  DM2 -not compliant with home medication.  Order sliding scale and continue to monitor order diabetic coordinator consult May need to start on low-dose insulin  Other plan as per orders.  DVT prophylaxis:  SCD      Code Status:    Code Status: Not on file FULL CODE   as per patient   I had personally discussed CODE STATUS with patient    Family Communication:   Family   at  Bedside  plan of care was discussed  with  Wife,    Disposition Plan:    To home once workup is complete and patient is stable   Following barriers for discharge:                            Electrolytes corrected                               Anemia corrected                             Pain controlled with PO medications                                able to transition to PO antibiotics                             Will need to be able to tolerate PO  Will  need consultants to evaluate patient prior to discharge     Would benefit from PT/OT eval prior to DC  Ordered                                    Consults called:  Urology   Admission status:  ED Disposition     ED Disposition  [place]: [place] [[id number]]  Level of Care: Progressive [102]  Admit to Progressive based on following criteria: MULTISYSTEM THREATS such as stable sepsis, metabolic/electrolyte imbalance with or without encephalopathy that is responding to early treatment.  May admit patient to [facility] or [facility] if equivalent level of care is available:: No  Covid Evaluation: Confirmed COVID Negative  Diagnosis: Fournier gangrene [[id number]]  Admitting Physician: Toy Baker [[id number]]  Attending Physician: Toy Baker [[id number]]  Estimated length of stay: past midnight tomorrow  Certification:: I certify this patient will need inpatient services for at least 2 midnights           inpatient     I Expect 2 midnight stay secondary to severity of patient's current illness need for inpatient interventions justified by the following:  Severe lab/radiological/exam abnormalities including:    Possible Fournier gangreen  and extensive comorbidities including:  DM2   Morbid Obesity    That are currently affecting medical management.   I expect  patient to be hospitalized for 2 midnights requiring inpatient medical care.  Patient is at high risk for adverse outcome (such as loss of life or disability) if not treated.  Indication for inpatient stay as follows:    inability to maintain oral hydration   Need for emergent surgical intervention  Need for IV antibiotics, IV fluids, , IV pain medications,      Level of care   progressive  tele indefinitely please discontinue once patient no longer qualifies COVID-19 Labs    Lab Results  Component Value Date   [place] 06/05/2021      Precautions: admitted as Covid Negative  PPE: Used by the provider:   N95  eye Goggles,  Gloves    Talor Cheema 06/05/2021, 8:57 PM    [facility]     after 2 AM please page floor coverage PA If 7AM-7PM, please contact the day team taking care of the patient using [email or web address]   Patient was evaluated in the context of the global COVID-19 pandemic, which necessitated consideration that the patient might be at risk for infection with the SARS-CoV-2 virus that causes COVID-19. Institutional protocols and algorithms that pertain to the evaluation of patients at risk for COVID-19 are in a state of rapid change based on information released by regulatory bodies including the CDC and federal and state organizations. These policies and algorithms were followed during the patient's care.

## 2021-06-05 NOTE — Consult Note (Signed)
Urology Consult   Physician requesting consult: Dr. Fredia Sorrow  Reason for consult: Scrotal abscess vs Fournier's gangrene  COVID status: Negative  History of Present Illness: Robert Lewis is a 55 y.o. patient with diabetes who admittedly has not been taking any medication recently.  He developed the gradual onset of worsening right scrotal and groin pain with increased swelling over the past two days.  He presented to the ED and a CT scan of the pelvis was performed and does demonstrate significant stranding along the right hemiscrotum extending up toward the right inguinal region with areas of gas concerning for abscess formation vs early Fournier's gangrene.  He has been afebrile and hemodynamically stable.  He was started on Zosyn, Vancomycin, and Clindamycin and transferred from [facility] to the [facility] ED.  He denies a history of voiding or storage urinary symptoms, hematuria, UTIs, STDs, urolithiasis, GU malignancy/trauma/surgery.  Past Medical History:  Diagnosis Date   Agatston coronary artery calcium score between 200 and 399 05/2019   Controlled diabetes mellitus type II without complication ([place])    Not on insulin   Hyperlipidemia associated with type 2 diabetes mellitus ([place])    Hypertension    Morbid obesity with BMI of 45.0-49.9, adult (HCC)    OSA on CPAP     Past Surgical History:  Procedure Laterality Date   APPENDECTOMY     FEMUR FRACTURE SURGERY Left    ORIF with pin in place   TRANSTHORACIC ECHOCARDIOGRAM  05/2019   EF 60-65%.  Normal wall motion.  Mild LVH.  Normal RV size and function.  Normal atria.  Mild MAC.  Mild aortic sclerosis with no stenosis.--Essentially normal.    Medications:  Home meds:  No current facility-administered medications on file prior to encounter.   No current outpatient medications on file prior to encounter.     Scheduled Meds:  lidocaine-EPINEPHrine  30 mL Infiltration Once   lidocaine-EPINEPHrine        Continuous Infusions:  clindamycin (CLEOCIN) IV     lactated ringers     piperacillin-tazobactam 3.375 g (06/05/21 1636)   vancomycin     vancomycin     PRN Meds:.  Allergies: No Known Allergies  Family History  Problem Relation Age of Onset   Prostate cancer Father    Heart attack Father 82   Pulmonary embolism Father 93       Cause of death   Heart attack Paternal Grandfather        Presumably in his 32s    Social History:  reports that he has never smoked. His smokeless tobacco use includes chew. He reports that he does not use drugs. No history on file for alcohol use.  ROS: A complete review of systems was performed.  All systems are negative except for pertinent findings as noted.  Physical Exam:  Vital signs in last 24 hours: Temp:  [98.6 F (37 C)] 98.6 F (37 C) (09/14 1236) Pulse Rate:  [89-106] 95 (09/14 1540) Resp:  [16-22] 16 (09/14 1540) BP: (152-172)/(91-100) 172/91 (09/14 1540) SpO2:  [97 %-100 %] 100 % (09/14 1540) Weight:  [163.3 kg] 163.3 kg (09/14 1237) Constitutional:  Alert and oriented, No acute distress Cardiovascular: Regular rate and rhythm, No JVD Respiratory: Normal respiratory effort, Lungs clear bilaterally GI: Abdomen is obese, soft, nontender, nondistended, no abdominal masses Genitourinary: No CVAT. Normal male phallus, testes are descended bilaterally and non-tender and without masses. On the lateral aspect of the right hemiscrotum, there is an erythematous  and tender area that is indurated measuring about 6 x 3 cm.  There is no fluctuance or drainage noted. Lymphatic: No lymphadenopathy Neurologic: Grossly intact, no focal deficits Psychiatric: Normal mood and affect  Laboratory Data:  Recent Labs    06/05/21 1431  WBC 16.2*  HGB 14.1  HCT 42.2  PLT 227    Recent Labs    06/05/21 1431  NA 135  K 3.7  CL 100  GLUCOSE 332*  BUN 10  CALCIUM 8.8*  CREATININE 0.75     Results for orders placed or performed during  the hospital encounter of 06/05/21 (from the past 24 hour(s))  Basic metabolic panel     Status: Abnormal   Collection Time: 06/05/21  2:31 PM  Result Value Ref Range   Sodium 135 135 - 145 mmol/L   Potassium 3.7 3.5 - 5.1 mmol/L   Chloride 100 98 - 111 mmol/L   CO2 26 22 - 32 mmol/L   Glucose, Bld 332 (H) 70 - 99 mg/dL   BUN 10 6 - 20 mg/dL   Creatinine, Ser 0.75 0.61 - 1.24 mg/dL   Calcium 8.8 (L) 8.9 - 10.3 mg/dL   GFR, Estimated >60 >60 mL/min   Anion gap 9 5 - 15  CBC with Differential     Status: Abnormal   Collection Time: 06/05/21  2:31 PM  Result Value Ref Range   WBC 16.2 (H) 4.0 - 10.5 K/uL   RBC 4.79 4.22 - 5.81 MIL/uL   Hemoglobin 14.1 13.0 - 17.0 g/dL   HCT 42.2 39.0 - 52.0 %   MCV 88.1 80.0 - 100.0 fL   MCH 29.4 26.0 - 34.0 pg   MCHC 33.4 30.0 - 36.0 g/dL   RDW 12.2 11.5 - 15.5 %   Platelets 227 150 - 400 K/uL   nRBC 0.0 0.0 - 0.2 %   Neutrophils Relative % 74 %   Neutro Abs 12.2 (H) 1.7 - 7.7 K/uL   Lymphocytes Relative 15 %   Lymphs Abs 2.4 0.7 - 4.0 K/uL   Monocytes Relative 8 %   Monocytes Absolute 1.3 (H) 0.1 - 1.0 K/uL   Eosinophils Relative 1 %   Eosinophils Absolute 0.1 0.0 - 0.5 K/uL   Basophils Relative 1 %   Basophils Absolute 0.1 0.0 - 0.1 K/uL   Immature Granulocytes 1 %   Abs Immature Granulocytes 0.14 (H) 0.00 - 0.07 K/uL  Lactic acid, plasma     Status: None   Collection Time: 06/05/21  2:31 PM  Result Value Ref Range   Lactic Acid, Venous 1.3 0.5 - 1.9 mmol/L  Urinalysis, Routine w reflex microscopic Urine, Clean Catch     Status: Abnormal   Collection Time: 06/05/21  2:31 PM  Result Value Ref Range   Color, Urine YELLOW YELLOW   APPearance CLEAR CLEAR   Specific Gravity, Urine >1.046 (H) 1.005 - 1.030   pH 5.5 5.0 - 8.0   Glucose, UA >1,000 (A) NEGATIVE mg/dL   Hgb urine dipstick NEGATIVE NEGATIVE   Bilirubin Urine NEGATIVE NEGATIVE   Ketones, ur NEGATIVE NEGATIVE mg/dL   Protein, ur TRACE (A) NEGATIVE mg/dL   Nitrite NEGATIVE  NEGATIVE   Leukocytes,Ua NEGATIVE NEGATIVE   RBC / HPF 0-5 0 - 5 RBC/hpf   WBC, UA 0-5 0 - 5 WBC/hpf   Bacteria, UA RARE (A) NONE SEEN   Squamous Epithelial / LPF 0-5 0 - 5   No results found for this or any previous visit (from  the past 240 hour(s)).  Renal Function: Recent Labs    06/05/21 1431  CREATININE 0.75   Estimated Creatinine Clearance: 173.1 mL/min (by C-G formula based on SCr of 0.75 mg/dL).  Radiologic Imaging: CT PELVIS W CONTRAST  Result Date: 06/05/2021 CLINICAL DATA:  Hernia, right scrotal/inguinal abscess EXAM: CT PELVIS WITH CONTRAST TECHNIQUE: Multidetector CT imaging of the pelvis was performed using the standard protocol following the bolus administration of intravenous contrast. CONTRAST:  189m OMNIPAQUE IOHEXOL 350 MG/ML SOLN COMPARISON:  None. FINDINGS: Urinary Tract: The bladder lumen measures greater than simple fluid attenuation. No focal lesions are identified. Bowel:  The imaged bowel is unremarkable. Vascular/Lymphatic: The imaged vasculature is unremarkable. There are a few prominent bilateral inguinal lymph nodes. There is no pathologic lymphadenopathy in the pelvis. Reproductive:  The prostate and seminal vesicles are unremarkable. Other: There is significant inflammatory change in the right groin extending into the scrotum. There are bilateral hydroceles. Musculoskeletal: Left femur hardware is partially imaged. There is no acute osseous abnormality in the pelvis. IMPRESSION: 1. Findings above consistent with right scrotal/groin cellulitis with bilateral hydroceles. There are small locules of gas in the right groin; infection with a gas-forming organism/Fournier's gangrene cannot be excluded. 2. The bladder lumen measures greater than simple fluid attenuation. This may reflect blood or contrast from a prior imaging study. Correlate with urinalysis as indicated. These results were called by telephone at the time of interpretation on 06/05/2021 at 3:48 pm to  [facility], who verbally acknowledged these results. Electronically Signed   By: PValetta MoleM.D.   On: 06/05/2021 15:49    I independently reviewed the above imaging studies.  Impression/Recommendation Scrotal abscess with poorly controlled diabetes:  I would recommend admission per the hospitalist service to control his diabetes and for supportive care.  Would continue broad spectrum antibiotics.  Although his exam is not particularly obvious for abscess, his CT findings are strongly suggestive.  I therefore recommended he proceed to the OR for incision and drainage.  Will plan to obtain cultures intraoperatively if abscess is indeed noted.  I discussed the potential benefits and risks of the procedure, side effects of the proposed treatment, the likelihood of the patient achieving the goals of the procedure, and any potential problems that might occur during the procedure or recuperation.  He gives informed consent to proceed.   LDutch Gray9/14/2022, 4:41 PM    LPryor CuriaMD  CC: Dr. ZRogene Houston

## 2021-06-05 NOTE — Transfer of Care (Signed)
Immediate Anesthesia Transfer of Care Note  Patient: Robert Lewis  Procedure(s) Performed: Procedure(s): IRRIGATION AND DEBRIDEMENT ABSCESS (Right)  Patient Location: PACU  Anesthesia Type:General  Level of Consciousness: Alert, Awake, Oriented  Airway & Oxygen Therapy: Patient Spontanous Breathing  Post-op Assessment: Report given to RN  Post vital signs: Reviewed and stable  Last Vitals:  Vitals:   06/05/21 2028 06/05/21 2030  BP: (!) 177/99   Pulse: 99 (!) 101  Resp:    Temp: 36.9 C   SpO2: 100% 100%    Complications: No apparent anesthesia complications

## 2021-06-05 NOTE — ED Provider Notes (Signed)
[place] EMERGENCY DEPT Provider Note   CSN: [id number] Arrival date & time: 06/05/21  1228     History Chief Complaint  Patient presents with   Abscess    Robert Lewis is a 55 y.o. male with past medical history of DM2, been off treatment for 1 year, hypertension, OSA on CPAP, obesity with a BMI of 46, who presents today for evaluation of 3 to 4 days of pain and swelling of the right sided inguinal area and scrotum.  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  He has type 2 diabetes, has not been taking any medicines for over a year.  His ex-wife, whom is at bedside, reports that his sugar was in the 300s prior to arrival.  He denies any fevers, he otherwise feels well.  HPI     Past Medical History:  Diagnosis Date   Agatston coronary artery calcium score between 200 and 399 05/2019   Controlled diabetes mellitus type II without complication ([place])    Not on insulin   Hyperlipidemia associated with type 2 diabetes mellitus ([place])    Hypertension    Morbid obesity with BMI of 45.0-49.9, adult ([place])    OSA on CPAP     Patient Active Problem List   Diagnosis Date Noted   Metabolic syndrome 09/32/3557   Essential hypertension 06/06/2019   Near syncope 06/06/2019   Type 2 diabetes mellitus without complication, without long-term current use of insulin ([place]) 06/06/2019   Morbid obesity ([place]) 06/06/2019   Agatston coronary artery calcium score between 200 and 399 05/2019    Past Surgical History:  Procedure Laterality Date   APPENDECTOMY     FEMUR FRACTURE SURGERY Left    ORIF with pin in place   TRANSTHORACIC ECHOCARDIOGRAM  05/2019   EF 60-65%.  Normal wall motion.  Mild LVH.  Normal RV size and function.  Normal atria.  Mild MAC.  Mild aortic sclerosis with no stenosis.--Essentially normal.       Family History  Problem Relation Age of Onset   Prostate cancer Father    Heart attack Father 21   Pulmonary  embolism Father 30       Cause of death   Heart attack Paternal Grandfather        Presumably in his 47s    Social History   Tobacco Use   Smoking status: Never   Smokeless tobacco: Current    Types: Chew  Vaping Use   Vaping Use: Never used  Substance Use Topics   Drug use: Never    Home Medications Prior to Admission medications   Not on File    Allergies    Patient has no known allergies.  Review of Systems   Review of Systems  Constitutional:  Positive for fatigue (Unchanged from baseline). Negative for fever.  Respiratory:  Negative for cough and shortness of breath.   Cardiovascular:  Negative for chest pain and leg swelling.  Gastrointestinal:  Negative for abdominal pain, diarrhea, nausea and vomiting.  Genitourinary:  Negative for penile pain and testicular pain.       Pain and swelling of inguinal area vs scrotum on right side.   Musculoskeletal:  Negative for back pain and neck pain.  Neurological:  Negative for weakness and headaches.   Physical Exam Updated Vital Signs BP 139/75   Pulse 91   Temp 98.4 F (36.9 C) (Oral)   Resp 16   Ht _0  (  1.88 m)   Wt (!) 163.3 kg   SpO2 98%   BMI 46.22 kg/m   Physical Exam Vitals and nursing note reviewed. 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  Constitutional:      General: He is not in acute distress.    Appearance: He is obese. He is not ill-appearing.  HENT:     Head: Atraumatic.  Eyes:     Conjunctiva/sclera: Conjunctivae normal.  Cardiovascular:     Rate and Rhythm: Normal rate.  Pulmonary:     Effort: Pulmonary effort is normal. No respiratory distress.  Abdominal:     General: There is no distension.  Genitourinary:    Comments: Exam limited by body habitus.  There is a baseball sized swelling in the right inguinal area extending into the right testicle with redness and tenderness.  No subcutaneous crepitus palpated.  There is a small wound over the area of  swelling Musculoskeletal:     Cervical back: Normal range of motion and neck supple.     Comments: No obvious acute injury  Skin:    General: Skin is warm.  Neurological:     Mental Status: He is alert.     Comments: Awake and alert, answers all questions appropriately.  Speech is not slurred.  Psychiatric:        Mood and Affect: Mood normal.        Behavior: Behavior normal.    ED Results / Procedures / Treatments   Labs (all labs ordered are listed, but only abnormal results are displayed) Labs Reviewed  BASIC METABOLIC PANEL - Abnormal; Notable for the following components:      Result Value   Glucose, Bld 332 (*)    Calcium 8.8 (*)    All other components within normal limits  CBC WITH DIFFERENTIAL/PLATELET - Abnormal; Notable for the following components:   WBC 16.2 (*)    Neutro Abs 12.2 (*)    Monocytes Absolute 1.3 (*)    Abs Immature Granulocytes 0.14 (*)    All other components within normal limits  URINALYSIS, ROUTINE W REFLEX MICROSCOPIC - Abnormal; Notable for the following components:   Specific Gravity, Urine >1.046 (*)    Glucose, UA >1,000 (*)    Protein, ur TRACE (*)    Bacteria, UA RARE (*)    All other components within normal limits  RESP PANEL BY RT-PCR (FLU A&B, COVID) ARPGX2  CULTURE, BLOOD (ROUTINE X 2)  CULTURE, BLOOD (ROUTINE X 2)  LACTIC ACID, PLASMA  LACTIC ACID, PLASMA    EKG None  Radiology CT PELVIS W CONTRAST  Result Date: 06/05/2021 CLINICAL DATA:  Hernia, right scrotal/inguinal abscess EXAM: CT PELVIS WITH CONTRAST TECHNIQUE: Multidetector CT imaging of the pelvis was performed using the standard protocol following the bolus administration of intravenous contrast. CONTRAST:  118m OMNIPAQUE IOHEXOL 350 MG/ML SOLN COMPARISON:  None. FINDINGS: Urinary Tract: The bladder lumen measures greater than simple fluid attenuation. No focal lesions are identified. Bowel:  The imaged bowel is unremarkable. Vascular/Lymphatic: The imaged  vasculature is unremarkable. There are a few prominent bilateral inguinal lymph nodes. There is no pathologic lymphadenopathy in the pelvis. Reproductive:  The prostate and seminal vesicles are unremarkable. Other: There is significant inflammatory change in the right groin extending into the scrotum. There are bilateral hydroceles. Musculoskeletal: Left femur hardware is partially imaged. There is no acute osseous abnormality in the pelvis. IMPRESSION: 1. Findings above consistent with right scrotal/groin cellulitis with bilateral hydroceles. There are small locules  of gas in the right groin; infection with a gas-forming organism/Fournier's gangrene cannot be excluded. 2. The bladder lumen measures greater than simple fluid attenuation. This may reflect blood or contrast from a prior imaging study. Correlate with urinalysis as indicated. These results were called by telephone at the time of interpretation on 06/05/2021 at 3:48 pm to provider [facility] , who verbally acknowledged these results. Electronically Signed   By: Valetta Mole M.D.   On: 06/05/2021 15:49    Procedures .Critical Care Performed by: Lorin Glass, PA-C Authorized by: Lorin Glass, PA-C   Critical care provider statement:    Critical care time (minutes):  45   Critical care time was exclusive of:  Separately billable procedures and treating other patients and teaching time   Critical care was time spent personally by me on the following activities:  Discussions with consultants, evaluation of patient's response to treatment, examination of patient, ordering and performing treatments and interventions, ordering and review of laboratory studies, ordering and review of radiographic studies, pulse oximetry, re-evaluation of patient's condition, obtaining history from patient or surrogate and review of old charts   I assumed direction of critical care for this patient from another provider in my specialty: no      Care discussed with: accepting provider at another facility     Medications Ordered in ED Medications  lidocaine-EPINEPHrine (XYLOCAINE W/EPI) 2 %-1:100000 (with pres) injection 30 mL (30 mLs Infiltration Not Given 06/05/21 1613)  lidocaine-EPINEPHrine (XYLOCAINE W/EPI) 2 %-1:200000 (PF) injection (  Not Given 06/05/21 1613)  lactated ringers infusion (has no administration in time range)  vancomycin (VANCOCIN) IVPB 1000 mg/200 mL premix (1,000 mg Intravenous New Bag/Given 06/05/21 1724)  clindamycin (CLEOCIN) IVPB 600 mg (has no administration in time range)  vancomycin (VANCOCIN) IVPB 1000 mg/200 mL premix (has no administration in time range)  vancomycin (VANCOREADY) IVPB 2000 mg/400 mL (has no administration in time range)  piperacillin-tazobactam (ZOSYN) IVPB 3.375 g (has no administration in time range)  iohexol (OMNIPAQUE) 350 MG/ML injection 100 mL (100 mLs Intravenous Contrast Given 06/05/21 1527)  morphine 4 MG/ML injection 4 mg (4 mg Intravenous Given 06/05/21 1552)  ondansetron (ZOFRAN) injection 4 mg (4 mg Intravenous Given 06/05/21 1548)  sodium chloride 0.9 % bolus 500 mL (500 mLs Intravenous New Bag/Given 06/05/21 1618)  piperacillin-tazobactam (ZOSYN) IVPB 3.375 g (0 g Intravenous Stopped 06/05/21 1706)    ED Course  I have reviewed the triage vital signs and the nursing notes.  Pertinent labs & imaging results that were available during my care of the patient were reviewed by me and considered in my medical decision making (see chart for details).  Clinical Course as of 06/05/21 1805  Wed Jun 05, 2021  1546 I spoke with radiology, concern for forniers.  [EH]  7253 Text paged on call urology [EH]  [phone] I spoke with Dr. Alinda Money of urology.  He requested that I ED to ED transfer patient for him to evaluate.  If indicated he will contact surgery. [EH]  1639 I spoke with Dr. Rogene Houston, ED at [facility] long who accepts patient for transfer. [EH]    Clinical Course User Index [EH]  Ollen Gross   MDM Rules/Calculators/A&P                          Patient is a 55 year old man who presents today for evaluation of pain and swelling in the right sided inguinal area.  He  has diabetes and has not been taking his medicines for over a year.  Here he was initially noted to be tachycardic and tachypneic, however this was after he was walking, and while resting he had a normal heart rate and respiratory rate.  He is afebrile.  On exam he has a large area of swelling in the right sided inguinal area versus scrotal area.  He does have a small wound without active draining.  Labs are reviewed, his white count is significantly elevated at 16.2.  BMP shows a glucose of 332.  UA shows over 1000 glucose with elevated specific gravity.  Lactic acid is not elevated. Given exam limited by body habitus, along with concern for deeper infection or Fournier's gangrene or other complication given patient's uncontrolled hyperglycemia CT pelvis is obtained.  CT pelvis shows concern for possibility of a gas-forming infection such as Fournier's gangrene along with bilateral hydroceles.  All patient does have a very small wound in the area, given that he has multiple risk factors for foreign years including uncontrolled hyperglycemia and significant leukocytosis I am concerned that this may be foreigners gangrene instead of simply air from the wound.  Patient is started on broad-spectrum antibiotics including Vanco, Zosyn, and clindamycin.  Pain medicine is ordered.  I spoke with Dr. Alinda Money of urology who requested emergent ED to ED transfer to [facility] with him being contacted on patient's arrival so that he can evaluated.  He states that if needed he will contact general surgery additionally.  COVID screening test was obtained which was negative.  Patient is transferred by [facility] to [facility] with Dr. Rogene Houston accepting.  Note: Portions of this report may have been transcribed using  voice recognition software. Every effort was made to ensure accuracy; however, inadvertent computerized transcription errors may be present   Final Clinical Impression(s) / ED Diagnoses Final diagnoses:  Fournier's gangrene    Rx / DC Orders ED Discharge Orders     None        Ollen Gross 06/05/21 1806    Gareth Morgan, MD 06/06/21 [phone]

## 2021-06-05 NOTE — ED Triage Notes (Signed)
Patient reports to the ER for an abscess in the scrotal region. 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

## 2021-06-05 NOTE — Anesthesia Procedure Notes (Addendum)
Procedure Name: Intubation Date/Time: 06/05/2021 7:46 PM Performed by: Gerald Leitz, CRNA Pre-anesthesia Checklist: Patient identified, Patient being monitored, Timeout performed, Emergency Drugs available and Suction available Patient Re-evaluated:Patient Re-evaluated prior to induction Oxygen Delivery Method: Circle system utilized Preoxygenation: Pre-oxygenation with 100% oxygen Induction Type: IV induction and Rapid sequence Ventilation: Mask ventilation without difficulty and Oral airway inserted - appropriate to patient size Laryngoscope Size: Mac and 3 Grade View: Grade III Tube type: Oral Tube size: 7.5 mm Number of attempts: 2 Airway Equipment and Method: Stylet and Video-laryngoscopy Placement Confirmation: ETT inserted through vocal cords under direct vision, positive ETCO2 and breath sounds checked- equal and bilateral Secured at: 23 cm Tube secured with: Tape Dental Injury: Teeth and Oropharynx as per pre-operative assessment  Difficulty Due To: Difficulty was anticipated, Difficult Airway- due to large tongue and Difficult Airway- due to anterior larynx Future Recommendations: Recommend- induction with short-acting agent, and alternative techniques readily available

## 2021-06-06 ENCOUNTER — Encounter (HOSPITAL_COMMUNITY): Payer: Self-pay | Admitting: Urology

## 2021-06-06 DIAGNOSIS — G4733 Obstructive sleep apnea (adult) (pediatric): Secondary | ICD-10-CM | POA: Diagnosis not present

## 2021-06-06 DIAGNOSIS — N493 Fournier gangrene: Secondary | ICD-10-CM | POA: Diagnosis not present

## 2021-06-06 DIAGNOSIS — E119 Type 2 diabetes mellitus without complications: Secondary | ICD-10-CM | POA: Diagnosis not present

## 2021-06-06 DIAGNOSIS — I10 Essential (primary) hypertension: Secondary | ICD-10-CM | POA: Diagnosis not present

## 2021-06-06 LAB — TSH: TSH: 0.464 u[IU]/mL (ref 0.350–4.500)

## 2021-06-06 LAB — CBC WITH DIFFERENTIAL/PLATELET
Abs Immature Granulocytes: 0.15 10*3/uL — ABNORMAL HIGH (ref 0.00–0.07)
Basophils Absolute: 0.1 10*3/uL (ref 0.0–0.1)
Basophils Relative: 0 %
Eosinophils Absolute: 0 10*3/uL (ref 0.0–0.5)
Eosinophils Relative: 0 %
HCT: 42.7 % (ref 39.0–52.0)
Hemoglobin: 14.4 g/dL (ref 13.0–17.0)
Immature Granulocytes: 1 %
Lymphocytes Relative: 5 %
Lymphs Abs: 1 10*3/uL (ref 0.7–4.0)
MCH: 30.3 pg (ref 26.0–34.0)
MCHC: 33.7 g/dL (ref 30.0–36.0)
MCV: 89.9 fL (ref 80.0–100.0)
Monocytes Absolute: 0.6 10*3/uL (ref 0.1–1.0)
Monocytes Relative: 3 %
Neutro Abs: 17.7 10*3/uL — ABNORMAL HIGH (ref 1.7–7.7)
Neutrophils Relative %: 91 %
Platelets: 242 10*3/uL (ref 150–400)
RBC: 4.75 MIL/uL (ref 4.22–5.81)
RDW: 12.3 % (ref 11.5–15.5)
WBC: 19.5 10*3/uL — ABNORMAL HIGH (ref 4.0–10.5)
nRBC: 0 % (ref 0.0–0.2)

## 2021-06-06 LAB — COMPREHENSIVE METABOLIC PANEL
ALT: 23 U/L (ref 0–44)
AST: 18 U/L (ref 15–41)
Albumin: 3.5 g/dL (ref 3.5–5.0)
Alkaline Phosphatase: 77 U/L (ref 38–126)
Anion gap: 11 (ref 5–15)
BUN: 12 mg/dL (ref 6–20)
CO2: 23 mmol/L (ref 22–32)
Calcium: 9.1 mg/dL (ref 8.9–10.3)
Chloride: 99 mmol/L (ref 98–111)
Creatinine, Ser: 0.75 mg/dL (ref 0.61–1.24)
GFR, Estimated: >60 mL/min (ref >60)
Glucose, Bld: 359 mg/dL — ABNORMAL HIGH (ref 70–99)
Potassium: 4.2 mmol/L (ref 3.5–5.1)
Sodium: 133 mmol/L — ABNORMAL LOW (ref 135–145)
Total Bilirubin: 1 mg/dL (ref 0.3–1.2)
Total Protein: 7.1 g/dL (ref 6.5–8.1)

## 2021-06-06 LAB — LIPID PANEL
Cholesterol: 186 mg/dL (ref 0–200)
HDL: 58 mg/dL (ref >40)
LDL Cholesterol: 115 mg/dL — ABNORMAL HIGH (ref 0–99)
Total CHOL/HDL Ratio: 3.2 RATIO
Triglycerides: 66 mg/dL (ref <150)
VLDL: 13 mg/dL (ref 0–40)

## 2021-06-06 LAB — GLUCOSE, CAPILLARY
Glucose-Capillary: 388 mg/dL — ABNORMAL HIGH (ref 70–99)
Glucose-Capillary: 297 mg/dL — ABNORMAL HIGH (ref 70–99)
Glucose-Capillary: 282 mg/dL — ABNORMAL HIGH (ref 70–99)
Glucose-Capillary: 288 mg/dL — ABNORMAL HIGH (ref 70–99)
Glucose-Capillary: 277 mg/dL — ABNORMAL HIGH (ref 70–99)

## 2021-06-06 LAB — HEMOGLOBIN A1C
Hgb A1c MFr Bld: 10.4 % — ABNORMAL HIGH (ref 4.8–5.6)
Mean Plasma Glucose: 251.78 mg/dL

## 2021-06-06 LAB — HIV ANTIBODY (ROUTINE TESTING W REFLEX): HIV Screen 4th Generation wRfx: NONREACTIVE

## 2021-06-06 LAB — PHOSPHORUS: Phosphorus: 4.2 mg/dL (ref 2.5–4.6)

## 2021-06-06 LAB — MAGNESIUM: Magnesium: 1.8 mg/dL (ref 1.7–2.4)

## 2021-06-06 MED ORDER — INSULIN GLARGINE-YFGN 100 UNIT/ML ~~LOC~~ SOLN
15.0000 [IU] | Freq: Every day | SUBCUTANEOUS | Status: DC
  Administered 2021-06-06: 15 [IU] via SUBCUTANEOUS
  Filled 2021-06-06: qty 0.15

## 2021-06-06 MED ORDER — ENOXAPARIN SODIUM 40 MG/0.4ML IJ SOSY
40.0000 mg | PREFILLED_SYRINGE | INTRAMUSCULAR | Status: DC
  Administered 2021-06-06 – 2021-06-08 (×3): 40 mg via SUBCUTANEOUS
  Filled 2021-06-06 (×3): qty 0.4

## 2021-06-06 MED ORDER — MORPHINE SULFATE (PF) 2 MG/ML IV SOLN
2.0000 mg | Freq: Once | INTRAVENOUS | Status: AC
  Administered 2021-06-06: 2 mg via INTRAVENOUS
  Filled 2021-06-06: qty 1

## 2021-06-06 MED ORDER — NICOTINE 21 MG/24HR TD PT24
21.0000 mg | MEDICATED_PATCH | Freq: Every day | TRANSDERMAL | Status: DC
  Administered 2021-06-06 – 2021-06-09 (×5): 21 mg via TRANSDERMAL
  Filled 2021-06-06 (×5): qty 1

## 2021-06-06 MED ORDER — INSULIN ASPART 100 UNIT/ML IJ SOLN
0.0000 [IU] | Freq: Three times a day (TID) | INTRAMUSCULAR | Status: DC
  Administered 2021-06-07: 3 [IU] via SUBCUTANEOUS
  Administered 2021-06-07: 5 [IU] via SUBCUTANEOUS
  Administered 2021-06-07: 3 [IU] via SUBCUTANEOUS
  Administered 2021-06-08: 7 [IU] via SUBCUTANEOUS
  Administered 2021-06-08 (×2): 1 [IU] via SUBCUTANEOUS
  Administered 2021-06-09 (×2): 2 [IU] via SUBCUTANEOUS

## 2021-06-06 MED ORDER — INSULIN ASPART 100 UNIT/ML IJ SOLN
5.0000 [IU] | Freq: Three times a day (TID) | INTRAMUSCULAR | Status: DC
  Administered 2021-06-06 – 2021-06-09 (×10): 5 [IU] via SUBCUTANEOUS

## 2021-06-06 NOTE — Progress Notes (Addendum)
Inpatient Diabetes Program Recommendations  AACE/ADA: New Consensus Statement on Inpatient Glycemic Control (2015)  Target Ranges:  Prepandial:   less than 140 mg/dL      Peak postprandial:   less than 180 mg/dL (1-2 hours)      Critically ill patients:  140 - 180 mg/dL  Results for Robert Lewis, Robert Lewis (MRN [id number]) as of 06/06/2021 11:43  Ref. Range 06/05/2021 20:29 06/05/2021 22:47 06/06/2021 03:41 06/06/2021 07:45 06/06/2021 11:32  Glucose-Capillary Latest Ref Range: 70 - 99 mg/dL  10 mg Decadron '@1951'  226 (H)  4 units Novolog 248 (H)  3 units Novolog  5 units Semglee '@2334'  388 (H)  9 units Novolog 297 (H)  5 units Novolog  282 (H)   Results for Robert Lewis, Robert Lewis (MRN [id number]) as of 06/06/2021 07:07  Ref. 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 Increase Semglee to 15 units QHS (0.1 units/kg)  2. Start Novolog Meal Coverage: Novolog 6 units TID with meals Hold if pt eats <50% of meal, Hold if pt NPO   3. When pt ready to discharge home: Pt told me he used to take Metformin BID + Ozempic injection once weekly Is willing to start both meds back if possible--wants to avoid insulin since he drives a truck--Has taken the Ozempic injections and states he had no problems with taking an injection once weekly Consider at time of discharge: --Ozempic 0.25 mg Qweek for 4 weeks then increase dose to 0.5 mg Qweek --Metformin 500 mg BID for 4 weeks then increase the dose to 1000 mg BID if tolerates Needs follow up with PCP as  well    Addendum 11am--Met w/ pt and ex-wife this AM at bedside.  Pt and wife told me pt stopped taking his Ozempic once weekly injection and Metformin BID over 1.5 years ago.  Stated he just didn't feel like taking the meds and stopped caring about his health.  Now with current infection and A1c of 10.4%, pt stated he knows his poor diabetes control put him in this situation and he is ready to make changes to get his health under better control.  We discussed his current A1c of 10.4%, goal CBGs for home, importance of better sugar management to both heal infection and prevent further infections.  Pt has been drinking about 3-4 (20 ounce) regular cokes for a long time now and has not been eating appropriate carbohydrate portion sizes.  Also, pt not active with exercise.  We discussed the importance of better nutrition and exercise with lowering CBGs at home and we also discussed restarting meds.  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  Pt told me he had no troubles giving the Ozempic injections and would be willing to restart if given the opportunity.  Ex-wife is a Marine scientist for [employer] and also has diabetes and is willing to assist pt as needed.  We also discussed basic diabetes diet info and I strongly encouraged pt to stop drinking any beverages with carbs/sugar.  Discussed what foods contain carbohydrates and how carbohydrates affect the body's blood sugar levels.  Encouraged patient to be careful with his portion sizes (especially grains, starchy vegetables, and fruits).  Explained to patient that men should have 60-75 grams of carbohydrates per meal per day.  Placed RD consult per pt request.  Also reviewed several helpful diabetes websites that can help pt better manage diabetes at home.     --Will follow patient during hospitalization--  Wyn Quaker RN, MSN, CDE Diabetes Coordinator Inpatient  Glycemic Control Team Team Pager: [phone] (8a-5p)

## 2021-06-06 NOTE — Anesthesia Postprocedure Evaluation (Signed)
Anesthesia Post Note  Patient: Robert Lewis  Procedure(s) Performed: IRRIGATION AND DEBRIDEMENT ABSCESS (Right: Scrotum)     Patient location during evaluation: PACU Anesthesia Type: General Level of consciousness: awake and alert Pain management: pain level controlled Vital Signs Assessment: post-procedure vital signs reviewed and stable Respiratory status: spontaneous breathing, nonlabored ventilation, respiratory function stable and patient connected to nasal cannula oxygen Cardiovascular status: blood pressure returned to baseline and stable Postop Assessment: no apparent nausea or vomiting Anesthetic complications: no   No notable events documented.  Last Vitals:  Vitals:   06/05/21 2115 06/05/21 2136  BP: (!) 149/91 (!) 149/77  Pulse: 97 97  Resp:  20  Temp: 37.3 C 37.5 C  SpO2: 92% 98%    Last Pain:  Vitals:   06/05/21 2136  TempSrc: Oral  PainSc:                  [name]

## 2021-06-06 NOTE — Progress Notes (Signed)
PROGRESS NOTE    ADANTE COURINGTON  ZSW:[id number] DOB: 1966-09-12 DOA: 06/05/2021 PCP: Marin Comment, FNP   Brief Narrative: Robert Lewis is a 55 y.o. male with a history of hypertension, diabetes mellitus type 2, obesity, OSA on CPAP. Patient presented secondary to scrotal swelling and pain and found to have evidence of infection concerning for Fournier gangrene. Patient started on empiric antibiotics and Urology consulted for I&D which was performed on 9/14.   Assessment & Plan:   Active Problems:   Essential hypertension   Type 2 diabetes mellitus without complication, without long-term current use of insulin (HCC)   Morbid obesity (HCC)   Fournier gangrene   OSA on CPAP   Right scrotal/inguinal abscess Urology consulted on admission. Concern for Fournier gangrene. Patient started empirically on Vancomycin, Clindamycin and  -Continue antibiotics for now and follow-up culture data  Diabetes mellitus, type 2 Not on therapy as an outpatient secondary to non-adherence. Hemoglobin A1C of 10.4%. Started on Semglee 5 units daily qhs and SSI sensitive scale. Complicated by receiving dexamethasone 10 mg. -Increase to Semglee 15 units qhs and add Novolog 5 units TID with meals  Primary hypertension Not on medication management as an outpatient. Relatively controlled.  OSA -CPAP qhs  Morbid obesity Body mass index is 46.22 kg/m.   DVT prophylaxis: Lovenox Code Status:   Code Status: Full Code Family Communication: Ex-wife at bedside Disposition Plan: Discharge home likely in 1-2 days pending culture speciation/susceptibilities   Consultants:  Urology  Procedures:  IRRIGATION AND DEBRIDEMENT OF RIGHT SCROTUM ABSCESS (06/16/2021)  Antimicrobials: Vancomycin Zosyn Clindamycin    Subjective: No significant issues this morning. No significant scrotal pain.  Objective: Vitals:   06/05/21 2115 06/05/21 2136 06/06/21 0205 06/06/21 0525  BP: (!) 149/91 (!)  149/77 (!) 147/82 135/79  Pulse: 97 97 76 63  Resp:  20 18 18   Temp: 99.1 F (37.3 C) 99.5 F (37.5 C) 98.4 F (36.9 C) 97.8 F (36.6 C)  TempSrc:  Oral Oral Oral  SpO2: 92% 98% 94% 95%  Weight:      Height:        Intake/Output Summary (Last 24 hours) at 06/06/2021 1444 Last data filed at 06/06/2021 0843 Gross per 24 hour  Intake 1439.35 ml  Output 2115 ml  Net -675.65 ml   Filed Weights   06/05/21 1237  Weight: (!) 163.3 kg    Examination:  General exam: Appears calm and comfortable Respiratory system: Clear to auscultation. Respiratory effort normal. Cardiovascular system: S1 & S2 heard, RRR. No murmurs, rubs, gallops or clicks. Gastrointestinal system: Abdomen is nondistended, soft and nontender. No organomegaly or masses felt. Normal bowel sounds heard. Central nervous system: Alert and oriented. No focal neurological deficits. Musculoskeletal: No edema. No calf tenderness Skin: No cyanosis. GU: deferred Psychiatry: Judgement and insight appear normal. Mood & affect appropriate.     Data Reviewed: I have personally reviewed following labs and imaging studies  CBC Lab Results  Component Value Date   WBC 19.5 (H) 06/06/2021   RBC 4.75 06/06/2021   HGB 14.4 06/06/2021   HCT 42.7 06/06/2021   MCV 89.9 06/06/2021   MCH 30.3 06/06/2021   PLT 242 06/06/2021   MCHC 33.7 06/06/2021   RDW 12.3 06/06/2021   LYMPHSABS 1.0 06/06/2021   MONOABS 0.6 06/06/2021   EOSABS 0.0 06/06/2021   BASOSABS 0.1 06/06/2021     Last metabolic panel Lab Results  Component Value Date   NA 133 (L) 06/06/2021   K  4.2 06/06/2021   CL 99 06/06/2021   CO2 23 06/06/2021   BUN 12 06/06/2021   CREATININE 0.75 06/06/2021   GLUCOSE 359 (H) 06/06/2021   GFRNONAA >60 06/06/2021   GFRAA >60 06/04/2019   CALCIUM 9.1 06/06/2021   PHOS 4.2 06/06/2021   PROT 7.1 06/06/2021   ALBUMIN 3.5 06/06/2021   BILITOT 1.0 06/06/2021   ALKPHOS 77 06/06/2021   AST 18 06/06/2021   ALT 23  06/06/2021   ANIONGAP 11 06/06/2021    CBG (last 3)  Recent Labs    06/06/21 0341 06/06/21 0745 06/06/21 1132  GLUCAP 388* 297* 282*     GFR: Estimated Creatinine Clearance: 173.1 mL/min (by C-G formula based on SCr of 0.75 mg/dL).  Coagulation Profile: No results for input(s): INR, PROTIME in the last 168 hours.  Recent Results (from the past 240 hour(s))  Culture, blood (routine x 2)     Status: None (Preliminary result)   Collection Time: 06/05/21  4:01 PM   Specimen: BLOOD  Result Value Ref Range Status   Specimen Description   Final    BLOOD BLOOD LEFT FOREARM Performed at [facility], [address] [place], [place] [address]    Special Requests   Final    Blood Culture adequate volume BOTTLES DRAWN AEROBIC AND ANAEROBIC Performed at [facility], [address] [place], [place] [address]    Culture   Final    NO GROWTH < 24 HOURS Performed at [facility], [address], [place], [place] [address]    Report Status PENDING  Incomplete  Resp Panel by RT-PCR (Flu A&B, Covid) Nasopharyngeal Swab     Status: None   Collection Time: 06/05/21  4:03 PM   Specimen: Nasopharyngeal Swab; Nasopharyngeal(NP) swabs in vial transport medium  Result Value Ref Range Status   SARS Coronavirus 2 by RT PCR NEGATIVE NEGATIVE Final    Comment: (NOTE) SARS-CoV-2 target nucleic acids are NOT DETECTED.  The SARS-CoV-2 RNA is generally detectable in upper respiratory specimens during the acute phase of infection. The lowest concentration of SARS-CoV-2 viral copies this assay can detect is 138 copies/mL. A negative result does not preclude SARS-Cov-2 infection and should not be used as the sole basis for treatment or other patient management decisions. A negative result may occur with  improper specimen collection/handling, submission of specimen other than nasopharyngeal swab, presence of viral mutation(s) within the areas targeted  by this assay, and inadequate number of viral copies(<138 copies/mL). A negative result must be combined with clinical observations, patient history, and epidemiological information. The expected result is Negative.  Fact Sheet for Patients:  BloggerCourse.com  Fact Sheet for Healthcare Providers:  SeriousBroker.it  This test is no t yet approved or cleared by the [place] FDA and  has been authorized for detection and/or diagnosis of SARS-CoV-2 by FDA under an Emergency Use Authorization (EUA). This EUA will remain  in effect (meaning this test can be used) for the duration of the COVID-19 declaration under Section 564(b)(1) of the Act, 21 U.S.C.section 360bbb-3(b)(1), unless the authorization is terminated  or revoked sooner.       Influenza A by PCR NEGATIVE NEGATIVE Final   Influenza B by PCR NEGATIVE NEGATIVE Final    Comment: (NOTE) The Xpert Xpress SARS-CoV-2/FLU/RSV plus assay is intended as an aid in the diagnosis of influenza from Nasopharyngeal swab specimens and should not be used as a sole basis for treatment. Nasal washings and aspirates are unacceptable for  Xpert Xpress SARS-CoV-2/FLU/RSV testing.  Fact Sheet for Patients: BloggerCourse.com  Fact Sheet for Healthcare Providers: SeriousBroker.it  This test is not yet approved or cleared by the [place] FDA and has been authorized for detection and/or diagnosis of SARS-CoV-2 by FDA under an Emergency Use Authorization (EUA). This EUA will remain in effect (meaning this test can be used) for the duration of the COVID-19 declaration under Section 564(b)(1) of the Act, 21 U.S.C. section 360bbb-3(b)(1), unless the authorization is terminated or revoked.  Performed at [facility], [address], [place], [place] [address]   Culture, blood (routine x 2)     Status: None (Preliminary  result)   Collection Time: 06/05/21  4:13 PM   Specimen: BLOOD  Result Value Ref Range Status   Specimen Description   Final    BLOOD BLOOD RIGHT HAND Performed at [facility], [address], [place], [place] [address]    Special Requests   Final    Blood Culture adequate volume BOTTLES DRAWN AEROBIC AND ANAEROBIC Performed at [facility], [address], [place], [place] [address]    Culture   Final    NO GROWTH < 24 HOURS Performed at [facility], [address], [place], [place] [address]    Report Status PENDING  Incomplete  Aerobic/Anaerobic Culture w Gram Stain (surgical/deep wound)     Status: None (Preliminary result)   Collection Time: 06/05/21  8:06 PM   Specimen: Wound; Abscess  Result Value Ref Range Status   Specimen Description   Final    WOUND RIGHT SCROTUM Performed at [facility], [address], [place], [place] [address]    Special Requests   Final    NONE Performed at [facility], [address] [place], [place] [address]    Gram Stain   Final    ABUNDANT WBC PRESENT,BOTH PMN AND MONONUCLEAR ABUNDANT GRAM POSITIVE COCCI IN PAIRS AND CHAINS FEW GRAM NEGATIVE RODS Performed at [facility], [address], [place], [place] [address]    Culture PENDING  Incomplete   Report Status PENDING  Incomplete        Radiology Studies: CT PELVIS W CONTRAST  Result Date: 06/05/2021 CLINICAL DATA:  Hernia, right scrotal/inguinal abscess EXAM: CT PELVIS WITH CONTRAST TECHNIQUE: Multidetector CT imaging of the pelvis was performed using the standard protocol following the bolus administration of intravenous contrast. CONTRAST:  OMNIPAQUE IOHEXOL 350 MG/ML SOLN COMPARISON:  None. FINDINGS: Urinary Tract: The bladder lumen measures greater than simple fluid attenuation. No focal lesions are identified. Bowel:  The imaged bowel is unremarkable. Vascular/Lymphatic: The  imaged vasculature is unremarkable. There are a few prominent bilateral inguinal lymph nodes. There is no pathologic lymphadenopathy in the pelvis. Reproductive:  The prostate and seminal vesicles are unremarkable. Other: There is significant inflammatory change in the right groin extending into the scrotum. There are bilateral hydroceles. Musculoskeletal: Left femur hardware is partially imaged. There is no acute osseous abnormality in the pelvis. IMPRESSION: 1. Findings above consistent with right scrotal/groin cellulitis with bilateral hydroceles. There are small locules of gas in the right groin; infection with a gas-forming organism/Fournier's gangrene cannot be excluded. 2. The bladder lumen measures greater than simple fluid attenuation. This may reflect blood or contrast from a prior imaging study. Correlate with urinalysis as indicated. These results were called by telephone at the time of interpretation on 06/05/2021 at 3:48 pm to provider [facility] , who verbally acknowledged  these results. Electronically Signed   By: Lesia Hausen M.D.   On: 06/05/2021 15:49        Scheduled Meds:  enoxaparin (LOVENOX) injection  40 mg Subcutaneous Q24H   insulin aspart  0-9 Units Subcutaneous Q4H   insulin glargine-yfgn  5 Units Subcutaneous QHS   lidocaine-EPINEPHrine  30 mL Infiltration Once    morphine injection  2 mg Intravenous Once   nicotine  21 mg Transdermal Daily   Continuous Infusions:  clindamycin (CLEOCIN) IV 900 mg (06/06/21 1240)   piperacillin-tazobactam (ZOSYN)  IV 3.375 g (06/06/21 1246)   vancomycin 1,250 mg (06/06/21 0925)     LOS: 1 day     Jacquelin Hawking, MD [facility] 06/06/2021, 2:44 PM  If 7PM-7AM, please contact night-coverage [email or web address]

## 2021-06-06 NOTE — Progress Notes (Signed)
Patient ID: Robert Lewis, male   DOB: August 13, 1966, 55 y.o.   MRN: [id number]  1 Day Post-Op Subjective: Pt s/p I&D of right scrotal/inguinal abscess last night.  Pt drowsy but doing well.  Pain controlled.  Objective: Vital signs in last 24 hours: Temp:  [98.4 F (36.9 C)-99.9 F (37.7 C)] 98.4 F (36.9 C) (09/15 0205) Pulse Rate:  [76-106] 76 (09/15 0205) Resp:  [16-22] 18 (09/15 0205) BP: (137-177)/(75-100) 147/82 (09/15 0205) SpO2:  [92 %-100 %] 94 % (09/15 0205) Weight:  [163.3 kg] 163.3 kg (09/14 1237)  Intake/Output from previous day: 09/14 0701 - 09/15 0700 In: 1370 [P.O.:120; I.V.:1200; IV Piggyback:50] Out: 1015 [Urine:1000; Blood:15] Intake/Output this shift: Total I/O In: 1320 [P.O.:120; I.V.:1200] Out: 1015 [Urine:1000; Blood:15]  Physical Exam:  General: Alert and oriented GU: Right scrotal wound packed  Lab Results: Recent Labs    06/05/21 1431 06/06/21 0345  HGB 14.1 14.4  HCT 42.2 42.7   BMET Recent Labs    06/05/21 1431 06/06/21 0345  NA 135 133*  K 3.7 4.2  CL 100 99  CO2 26 23  GLUCOSE 332* 359*  BUN 10 12  CREATININE 0.75 0.75  CALCIUM 8.8* 9.1     Studies/Results: CT PELVIS W CONTRAST  Result Date: 06/05/2021 CLINICAL DATA:  Hernia, right scrotal/inguinal abscess EXAM: CT PELVIS WITH CONTRAST TECHNIQUE: Multidetector CT imaging of the pelvis was performed using the standard protocol following the bolus administration of intravenous contrast. CONTRAST:  OMNIPAQUE IOHEXOL 350 MG/ML SOLN COMPARISON:  None. FINDINGS: Urinary Tract: The bladder lumen measures greater than simple fluid attenuation. No focal lesions are identified. Bowel:  The imaged bowel is unremarkable. Vascular/Lymphatic: The imaged vasculature is unremarkable. There are a few prominent bilateral inguinal lymph nodes. There is no pathologic lymphadenopathy in the pelvis. Reproductive:  The prostate and seminal vesicles are unremarkable. Other: There is significant  inflammatory change in the right groin extending into the scrotum. There are bilateral hydroceles. Musculoskeletal: Left femur hardware is partially imaged. There is no acute osseous abnormality in the pelvis. IMPRESSION: 1. Findings above consistent with right scrotal/groin cellulitis with bilateral hydroceles. There are small locules of gas in the right groin; infection with a gas-forming organism/Fournier's gangrene cannot be excluded. 2. The bladder lumen measures greater than simple fluid attenuation. This may reflect blood or contrast from a prior imaging study. Correlate with urinalysis as indicated. These results were called by telephone at the time of interpretation on 06/05/2021 at 3:48 pm to provider [facility] , who verbally acknowledged these results. Electronically Signed   By: Lesia Hausen M.D.   On: 06/05/2021 15:49    Assessment/Plan: Right scrotal/inguinal abscess: Begin dressing changes with wet to dry dressings today.  Follow wound cultures and tailor antibiotics.  Pt's ex-wife is a Engineer, civil (consulting) and will be able to perform dressing changes at home according to our conversation yesterday.   LOS: 1 day   [name] 06/06/2021, 5:24 AM

## 2021-06-07 DIAGNOSIS — G4733 Obstructive sleep apnea (adult) (pediatric): Secondary | ICD-10-CM | POA: Diagnosis not present

## 2021-06-07 DIAGNOSIS — N493 Fournier gangrene: Secondary | ICD-10-CM | POA: Diagnosis not present

## 2021-06-07 DIAGNOSIS — I10 Essential (primary) hypertension: Secondary | ICD-10-CM | POA: Diagnosis not present

## 2021-06-07 DIAGNOSIS — E119 Type 2 diabetes mellitus without complications: Secondary | ICD-10-CM | POA: Diagnosis not present

## 2021-06-07 LAB — CBC
HCT: 39.8 % (ref 39.0–52.0)
Hemoglobin: 13.2 g/dL (ref 13.0–17.0)
MCH: 30.1 pg (ref 26.0–34.0)
MCHC: 33.2 g/dL (ref 30.0–36.0)
MCV: 90.7 fL (ref 80.0–100.0)
Platelets: 234 10*3/uL (ref 150–400)
RBC: 4.39 MIL/uL (ref 4.22–5.81)
RDW: 12.2 % (ref 11.5–15.5)
WBC: 13.2 10*3/uL — ABNORMAL HIGH (ref 4.0–10.5)
nRBC: 0 % (ref 0.0–0.2)

## 2021-06-07 LAB — GLUCOSE, CAPILLARY
Glucose-Capillary: 246 mg/dL — ABNORMAL HIGH (ref 70–99)
Glucose-Capillary: 271 mg/dL — ABNORMAL HIGH (ref 70–99)
Glucose-Capillary: 216 mg/dL — ABNORMAL HIGH (ref 70–99)
Glucose-Capillary: 205 mg/dL — ABNORMAL HIGH (ref 70–99)

## 2021-06-07 LAB — CREATININE, SERUM
Creatinine, Ser: 0.94 mg/dL (ref 0.61–1.24)
GFR, Estimated: >60 mL/min (ref >60)

## 2021-06-07 MED ORDER — VANCOMYCIN HCL 1250 MG/250ML IV SOLN: 1250.0000 mg | Freq: Three times a day (TID) | INTRAVENOUS | Status: DC

## 2021-06-07 MED ORDER — SODIUM CHLORIDE 0.9 % IV SOLN
3.0000 g | Freq: Four times a day (QID) | INTRAVENOUS | Status: DC
  Administered 2021-06-07 – 2021-06-09 (×7): 3 g via INTRAVENOUS
  Filled 2021-06-07 (×6): qty 3
  Filled 2021-06-07: qty 8
  Filled 2021-06-07: qty 3

## 2021-06-07 MED ORDER — INSULIN GLARGINE-YFGN 100 UNIT/ML ~~LOC~~ SOLN
20.0000 [IU] | Freq: Every day | SUBCUTANEOUS | Status: DC
  Administered 2021-06-07 – 2021-06-08 (×2): 20 [IU] via SUBCUTANEOUS
  Filled 2021-06-07 (×2): qty 0.2

## 2021-06-07 NOTE — Progress Notes (Signed)
Pharmacy Antibiotic Note  Robert Lewis is a 55 y.o. male presented to the ED on 06/05/2021 with right scrotal/inguinal abscess.Initial concern for Fournier gangrene but no evidence of gangrenous infection noted. Patient initially placed on Vancomycin, Zosyn, and Clindamycin, now transitioning to Unasyn per ID recommendations. Pharmacy consulted for antibiotic dosing.    Plan: Unasyn 3g IV q6h Monitor renal function, cultures, clinical course, ID recommendations  _______________________________________ Height: 6\' 2"  (188 cm) Weight: (!) 163.3 kg (360 lb) IBW/kg (Calculated) : 82.2  Temp (24hrs), Avg:97.9 F (36.6 C), Min:97.9 F (36.6 C), Max:98 F (36.7 C)  Recent Labs  Lab 06/05/21 1431 06/06/21 0345 06/07/21 0331  WBC 16.2* 19.5* 13.2*  CREATININE 0.75 0.75 0.94  LATICACIDVEN 1.3  --   --      Estimated Creatinine Clearance: 147.3 mL/min (by C-G formula based on SCr of 0.94 mg/dL).    No Known Allergies   Thank you for allowing pharmacy to be a part of this patient's care.  06/09/21 06/07/2021 4:58 PM

## 2021-06-07 NOTE — Progress Notes (Addendum)
PROGRESS NOTE    Robert Lewis  ZOX:[id number] DOB: 1966/04/30 DOA: 06/05/2021 PCP: Marin Comment, FNP   Brief Narrative: Robert Lewis is a 55 y.o. male with a history of hypertension, diabetes mellitus type 2, obesity, OSA on CPAP. Patient presented secondary to scrotal swelling and pain and found to have evidence of infection concerning for Fournier gangrene. Patient started on empiric antibiotics and Urology consulted for I&D which was performed on 9/14.   Assessment & Plan:   Active Problems:   Essential hypertension   Type 2 diabetes mellitus without complication, without long-term current use of insulin (HCC)   Morbid obesity (HCC)   Scrotal abscess   OSA on CPAP   Right scrotal/inguinal abscess Urology consulted on admission. Patient started empirically on Vancomycin, Clindamycin and Zosyn. Patient underwent I&D on 9/14 by urology. Initial concern for Fournier gangrene but no evidence of gangrenous infection noted. Blood cultures no growth to date. Enterococcus on wound culture and GNR on gram stain -Discontinue Vancomycin, Clindamycin, Zosyn -Follow-up blood/wound cultures -Infectious disease consulted and will see in AM; switch vancomycin to Unasyn  Diabetes mellitus, type 2 Not on therapy as an outpatient secondary to non-adherence. Hemoglobin A1C of 10.4%. Started on Semglee 5 units daily qhs and SSI sensitive scale. Complicated by receiving dexamethasone 10 mg. Fasting blood sugar of 246 this morning. -Increase to Semglee 20 units qhs and continue Novolog 5 units TID with meals  Primary hypertension Not on medication management as an outpatient. Relatively controlled.  OSA -CPAP qhs  Morbid obesity Body mass index is 46.22 kg/m.   DVT prophylaxis: Lovenox Code Status:   Code Status: Full Code Family Communication: Ex-wife at bedside Disposition Plan: Discharge home likely in 24 hours pending culture speciation/susceptibilities   Consultants:   Urology  Procedures:  IRRIGATION AND DEBRIDEMENT OF RIGHT SCROTUM ABSCESS (06/16/2021)  Antimicrobials: Vancomycin Zosyn Clindamycin    Subjective: Not having much pain. Some pain in his flanks with cough.  Objective: Vitals:   06/06/21 0525 06/06/21 2116 06/07/21 0212 06/07/21 0622  BP: 135/79 (!) 152/74 135/75 131/81  Pulse: 63 85 77 72  Resp: 18 18 18 16   Temp: 97.8 F (36.6 C) 97.9 F (36.6 C) 97.9 F (36.6 C) 97.9 F (36.6 C)  TempSrc: Oral Oral  Oral  SpO2: 95% 99% 97% 96%  Weight:      Height:        Intake/Output Summary (Last 24 hours) at 06/07/2021 0805 Last data filed at 06/07/2021 0245 Gross per 24 hour  Intake 1234.63 ml  Output 1100 ml  Net 134.63 ml    Filed Weights   06/05/21 1237  Weight: (!) 163.3 kg    Examination:  General exam: Appears calm and comfortable Respiratory system: Clear to auscultation. Respiratory effort normal. Cardiovascular system: S1 & S2 heard, RRR. No murmurs, rubs, gallops or clicks. Gastrointestinal system: Abdomen is nondistended, soft and nontender. No organomegaly or masses felt. Normal bowel sounds heard. Central nervous system: Alert and oriented. No focal neurological deficits. GU: deferred Musculoskeletal: No calf tenderness Skin: No cyanosis. No rashes Psychiatry: Judgement and insight appear normal. Mood & affect appropriate.     Data Reviewed: I have personally reviewed following labs and imaging studies  CBC Lab Results  Component Value Date   WBC 13.2 (H) 06/07/2021   RBC 4.39 06/07/2021   HGB 13.2 06/07/2021   HCT 39.8 06/07/2021   MCV 90.7 06/07/2021   MCH 30.1 06/07/2021   PLT 234 06/07/2021   MCHC  33.2 06/07/2021   RDW 12.2 06/07/2021   LYMPHSABS 1.0 06/06/2021   MONOABS 0.6 06/06/2021   EOSABS 0.0 06/06/2021   BASOSABS 0.1 06/06/2021     Last metabolic panel Lab Results  Component Value Date   NA 133 (L) 06/06/2021   K 4.2 06/06/2021   CL 99 06/06/2021   CO2 23 06/06/2021    BUN 12 06/06/2021   CREATININE 0.94 06/07/2021   GLUCOSE 359 (H) 06/06/2021   GFRNONAA >60 06/07/2021   GFRAA >60 06/04/2019   CALCIUM 9.1 06/06/2021   PHOS 4.2 06/06/2021   PROT 7.1 06/06/2021   ALBUMIN 3.5 06/06/2021   BILITOT 1.0 06/06/2021   ALKPHOS 77 06/06/2021   AST 18 06/06/2021   ALT 23 06/06/2021   ANIONGAP 11 06/06/2021    CBG (last 3)  Recent Labs    06/06/21 1644 06/06/21 2127 06/07/21 0737  GLUCAP 288* 277* 246*      GFR: Estimated Creatinine Clearance: 147.3 mL/min (by C-G formula based on SCr of 0.94 mg/dL).  Coagulation Profile: No results for input(s): INR, PROTIME in the last 168 hours.  Recent Results (from the past 240 hour(s))  Culture, blood (routine x 2)     Status: None (Preliminary result)   Collection Time: 06/05/21  4:01 PM   Specimen: BLOOD  Result Value Ref Range Status   Specimen Description   Final    BLOOD BLOOD LEFT FOREARM Performed at [facility], [address], [place], [place] [address]    Special Requests   Final    Blood Culture adequate volume BOTTLES DRAWN AEROBIC AND ANAEROBIC Performed at [facility], [address] [place], [place] [address]    Culture   Final    NO GROWTH < 24 HOURS Performed at [facility], [address], [place], [place] [address]    Report Status PENDING  Incomplete  Resp Panel by RT-PCR (Flu A&B, Covid) Nasopharyngeal Swab     Status: None   Collection Time: 06/05/21  4:03 PM   Specimen: Nasopharyngeal Swab; Nasopharyngeal(NP) swabs in vial transport medium  Result Value Ref Range Status   SARS Coronavirus 2 by RT PCR NEGATIVE NEGATIVE Final    Comment: (NOTE) SARS-CoV-2 target nucleic acids are NOT DETECTED.  The SARS-CoV-2 RNA is generally detectable in upper respiratory specimens during the acute phase of infection. The lowest concentration of SARS-CoV-2 viral copies this assay can detect is 138 copies/mL. A negative result does not  preclude SARS-Cov-2 infection and should not be used as the sole basis for treatment or other patient management decisions. A negative result may occur with  improper specimen collection/handling, submission of specimen other than nasopharyngeal swab, presence of viral mutation(s) within the areas targeted by this assay, and inadequate number of viral copies(<138 copies/mL). A negative result must be combined with clinical observations, patient history, and epidemiological information. The expected result is Negative.  Fact Sheet for Patients:  BloggerCourse.com  Fact Sheet for Healthcare Providers:  SeriousBroker.it  This test is no t yet approved or cleared by the [place] FDA and  has been authorized for detection and/or diagnosis of SARS-CoV-2 by FDA under an Emergency Use Authorization (EUA). This EUA will remain  in effect (meaning this test can be used) for the duration of the COVID-19 declaration under Section 564(b)(1) of the Act, 21 U.S.C.section 360bbb-3(b)(1), unless the authorization is terminated  or revoked sooner.       Influenza A by PCR NEGATIVE NEGATIVE Final  Influenza B by PCR NEGATIVE NEGATIVE Final    Comment: (NOTE) The Xpert Xpress SARS-CoV-2/FLU/RSV plus assay is intended as an aid in the diagnosis of influenza from Nasopharyngeal swab specimens and should not be used as a sole basis for treatment. Nasal washings and aspirates are unacceptable for Xpert Xpress SARS-CoV-2/FLU/RSV testing.  Fact Sheet for Patients: BloggerCourse.com  Fact Sheet for Healthcare Providers: SeriousBroker.it  This test is not yet approved or cleared by the [place] FDA and has been authorized for detection and/or diagnosis of SARS-CoV-2 by FDA under an Emergency Use Authorization (EUA). This EUA will remain in effect (meaning this test can be used) for the  duration of the COVID-19 declaration under Section 564(b)(1) of the Act, 21 U.S.C. section 360bbb-3(b)(1), unless the authorization is terminated or revoked.  Performed at [facility], [address], [place], [place] [address]   Culture, blood (routine x 2)     Status: None (Preliminary result)   Collection Time: 06/05/21  4:13 PM   Specimen: BLOOD  Result Value Ref Range Status   Specimen Description   Final    BLOOD BLOOD RIGHT HAND Performed at [facility], [address] [place], [place] [address]    Special Requests   Final    Blood Culture adequate volume BOTTLES DRAWN AEROBIC AND ANAEROBIC Performed at [facility], [address], [place], [place] [address]    Culture   Final    NO GROWTH < 24 HOURS Performed at [facility], [address] [place], [place] [address]    Report Status PENDING  Incomplete  Aerobic/Anaerobic Culture w Gram Stain (surgical/deep wound)     Status: None (Preliminary result)   Collection Time: 06/05/21  8:06 PM   Specimen: Wound; Abscess  Result Value Ref Range Status   Specimen Description   Final    WOUND RIGHT SCROTUM Performed at [facility], [address] [place], [place] [address]    Special Requests   Final    NONE Performed at [facility], [address], [place], [place] [address]    Gram Stain   Final    ABUNDANT WBC PRESENT,BOTH PMN AND MONONUCLEAR ABUNDANT GRAM POSITIVE COCCI IN PAIRS AND CHAINS FEW GRAM NEGATIVE RODS Performed at [facility], [address] [place], [place] [address]    Culture PENDING  Incomplete   Report Status PENDING  Incomplete         Radiology Studies: CT PELVIS W CONTRAST  Result Date: 06/05/2021 CLINICAL DATA:  Hernia, right scrotal/inguinal abscess EXAM: CT PELVIS WITH CONTRAST TECHNIQUE: Multidetector CT imaging of the pelvis was performed using the standard protocol  following the bolus administration of intravenous contrast. CONTRAST:  OMNIPAQUE IOHEXOL 350 MG/ML SOLN COMPARISON:  None. FINDINGS: Urinary Tract: The bladder lumen measures greater than simple fluid attenuation. No focal lesions are identified. Bowel:  The imaged bowel is unremarkable. Vascular/Lymphatic: The imaged vasculature is unremarkable. There are a few prominent bilateral inguinal lymph nodes. There is no pathologic lymphadenopathy in the pelvis. Reproductive:  The prostate and seminal vesicles are unremarkable. Other: There is significant inflammatory change in the right groin extending into the scrotum. There are bilateral hydroceles. Musculoskeletal: Left femur hardware is partially imaged. There is no acute osseous abnormality in the pelvis. IMPRESSION: 1. Findings above consistent with right scrotal/groin cellulitis with bilateral hydroceles. There are small locules of gas in the right groin; infection with a gas-forming organism/Fournier's gangrene cannot be  excluded. 2. The bladder lumen measures greater than simple fluid attenuation. This may reflect blood or contrast from a prior imaging study. Correlate with urinalysis as indicated. These results were called by telephone at the time of interpretation on 06/05/2021 at 3:48 pm to provider [facility] , who verbally acknowledged these results. Electronically Signed   By: Lesia Hausen M.D.   On: 06/05/2021 15:49        Scheduled Meds:  enoxaparin (LOVENOX) injection  40 mg Subcutaneous Q24H   insulin aspart  0-9 Units Subcutaneous TID WC   insulin aspart  5 Units Subcutaneous TID WC   insulin glargine-yfgn  15 Units Subcutaneous QHS   lidocaine-EPINEPHrine  30 mL Infiltration Once   nicotine  21 mg Transdermal Daily   Continuous Infusions:  clindamycin (CLEOCIN) IV 900 mg (06/07/21 0211)   piperacillin-tazobactam (ZOSYN)  IV 3.375 g (06/06/21 2317)   vancomycin 1,250 mg (06/07/21 0242)     LOS: 2 days     Jacquelin Hawking, MD [facility] 06/07/2021, 8:05 AM  If 7PM-7AM, please contact night-coverage [email or web address]

## 2021-06-07 NOTE — Progress Notes (Signed)
Inpatient Diabetes Program Recommendations  AACE/ADA: New Consensus Statement on Inpatient Glycemic Control (2015)  Target Ranges:  Prepandial:   less than 140 mg/dL      Peak postprandial:   less than 180 mg/dL (1-2 hours)      Critically ill patients:  140 - 180 mg/dL   Results for Robert Lewis, Robert Lewis (MRN [id number]) as of 06/07/2021 14:05  Ref. 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   PCP listed as , FNP--last seen Feb 2021     MD- Please consider Increasing the Novolog Meal Coverage to 8 units TID with meals     When pt ready to discharge home: Pt told me he used to take Metformin BID + Ozempic injection once weekly Is willing to start both meds back if possible--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

## 2021-06-07 NOTE — Progress Notes (Signed)
Nutrition Note  RD consulted for nutrition education regarding diabetes.   Lab Results  Component Value Date   HGBA1C 10.4 (H) 06/05/2021    RD provided "Carbohydrate Counting for People with Diabetes" handout from the Academy of Nutrition and Dietetics. Also provided handouts for eating on the go and food labels. Discussed different food groups and their effects on blood sugar, emphasizing carbohydrate-containing foods. Provided list of carbohydrates and recommended serving sizes of common foods.  Discussed importance of controlled and consistent carbohydrate intake throughout the day. Provided examples of ways to balance meals/snacks and encouraged intake of high-fiber, whole grain complex carbohydrates. Teach back method used.  Expect good compliance. Pt with support from ex-wife.   Body mass index is 46.22 kg/m. Pt meets criteria for morbid obesity based on current BMI.  Current diet order is CHO modified, patient is consuming approximately 100% of meals at this time. Labs and medications reviewed. No further nutrition interventions warranted at this time. If additional nutrition issues arise, please re-consult RD.  Tilda Franco, MS, RD, LDN Inpatient Clinical Dietitian Contact information available via Amion

## 2021-06-08 DIAGNOSIS — N492 Inflammatory disorders of scrotum: Secondary | ICD-10-CM | POA: Diagnosis not present

## 2021-06-08 DIAGNOSIS — E119 Type 2 diabetes mellitus without complications: Secondary | ICD-10-CM | POA: Diagnosis not present

## 2021-06-08 DIAGNOSIS — A4181 Sepsis due to Enterococcus: Secondary | ICD-10-CM

## 2021-06-08 DIAGNOSIS — G4733 Obstructive sleep apnea (adult) (pediatric): Secondary | ICD-10-CM

## 2021-06-08 DIAGNOSIS — Z9989 Dependence on other enabling machines and devices: Secondary | ICD-10-CM

## 2021-06-08 LAB — CBC
HCT: 41.9 % (ref 39.0–52.0)
Hemoglobin: 13.8 g/dL (ref 13.0–17.0)
MCH: 29.9 pg (ref 26.0–34.0)
MCHC: 32.9 g/dL (ref 30.0–36.0)
MCV: 90.7 fL (ref 80.0–100.0)
Platelets: 261 10*3/uL (ref 150–400)
RBC: 4.62 MIL/uL (ref 4.22–5.81)
RDW: 12.5 % (ref 11.5–15.5)
WBC: 9.9 10*3/uL (ref 4.0–10.5)
nRBC: 0 % (ref 0.0–0.2)

## 2021-06-08 LAB — GLUCOSE, CAPILLARY
Glucose-Capillary: 325 mg/dL — ABNORMAL HIGH (ref 70–99)
Glucose-Capillary: 149 mg/dL — ABNORMAL HIGH (ref 70–99)
Glucose-Capillary: 128 mg/dL — ABNORMAL HIGH (ref 70–99)
Glucose-Capillary: 238 mg/dL — ABNORMAL HIGH (ref 70–99)

## 2021-06-08 NOTE — Consult Note (Signed)
[facility]    Date of Admission:  06/05/2021     Reason for Consult:Scrotal abscess     Referring Physician: Dr Caleb Popp  Current antibiotics: Unasyn  ASSESSMENT:    55 y.o. male admitted with:  Scrotal abscess 2/2 E faecalis s/p I&D 9/14 Uncontrolled DM2 Sepsis due to #1  RECOMMENDATIONS:    Continue Unasyn Follow up final cultures and susceptibilities to see if GNR on Gram stain grows in culture (might be anaerobe) If no changes to current cultures and susceptibilities allow, Augmentin at discharge would be option Agree with ~10 day of antibiotics from date of I&D Will follow   Active Problems:   Essential hypertension   Type 2 diabetes mellitus without complication, without long-term current use of insulin (HCC)   Morbid obesity (HCC)   Scrotal abscess   OSA on CPAP   MEDICATIONS:    Scheduled Meds:  enoxaparin (LOVENOX) injection  40 mg Subcutaneous Q24H   insulin aspart  0-9 Units Subcutaneous TID WC   insulin aspart  5 Units Subcutaneous TID WC   insulin glargine-yfgn  20 Units Subcutaneous QHS   lidocaine-EPINEPHrine  30 mL Infiltration Once   nicotine  21 mg Transdermal Daily   Continuous Infusions:  ampicillin-sulbactam (UNASYN) IV 3 g (06/08/21 0839)   PRN Meds:.acetaminophen **OR** acetaminophen, fentaNYL (SUBLIMAZE) injection  HPI:    Robert Lewis is a 55 y.o. male with a past medical history of hypertension, diabetes (A1c 10.4), obesity, OSA who was admitted 06/05/2021 with sepsis secondary to scrotal swelling and pain found to have scrotal abscess status post incision and drainage with urology on 06/05/2021.  There was no evidence of gangrenous infection.  Operative note reviewed in which purulent drainage was noted immediately.  He has been on broad-spectrum antibiotics while admitted and operative cultures have now grown Enterococcus faecalis with susceptibilities pending.  His blood cultures x2 are no growth to  date at 3 days.  His antibiotics were narrowed yesterday to Unasyn.   Past Medical History:  Diagnosis Date   Agatston coronary artery calcium score between 200 and 399 05/2019   Controlled diabetes mellitus type II without complication (HCC)    Not on insulin   Hyperlipidemia associated with type 2 diabetes mellitus (HCC)    Hypertension    Morbid obesity with BMI of 45.0-49.9, adult (HCC)    OSA on CPAP     Social History   Tobacco Use   Smoking status: Never   Smokeless tobacco: Current    Types: Chew  Vaping Use   Vaping Use: Never used  Substance Use Topics   Drug use: Never    Family History  Problem Relation Age of Onset   Prostate cancer Father    Heart attack Father 73   Pulmonary embolism Father 61       Cause of death   Heart attack Paternal Grandfather        Presumably in his 38s    No Known Allergies  Review of Systems  Constitutional:  Negative for fever and malaise/fatigue.  Respiratory: Negative.    Cardiovascular: Negative.   Gastrointestinal: Negative.   Genitourinary:        + scrotal pain/swelling s/p I&D  Musculoskeletal: Negative.   Skin:  Negative for rash.  All other systems reviewed and are negative.  OBJECTIVE:   Blood pressure (!) 167/96, pulse 71, temperature 97.8 F (36.6 C), temperature source Oral, resp. rate 18, height 6\' 2"  (1.88 m), weight )  163.3 kg, SpO2 97 %. Body mass index is 46.22 kg/m.  Physical Exam Constitutional:      General: He is not in acute distress.    Appearance: Normal appearance.  HENT:     Head: Normocephalic and atraumatic.  Eyes:     Extraocular Movements: Extraocular movements intact.     Conjunctiva/sclera: Conjunctivae normal.  Pulmonary:     Effort: Pulmonary effort is normal. No respiratory distress.  Musculoskeletal:        General: Normal range of motion.     Cervical back: Normal range of motion and neck supple.  Skin:    General: Skin is warm and dry.     Findings: No rash.   Neurological:     General: No focal deficit present.     Mental Status: He is alert and oriented to person, place, and time.  Psychiatric:        Mood and Affect: Mood normal.        Behavior: Behavior normal.     Lab Results: Lab Results  Component Value Date   WBC 9.9 06/08/2021   HGB 13.8 06/08/2021   HCT 41.9 06/08/2021   MCV 90.7 06/08/2021   PLT 261 06/08/2021    Lab Results  Component Value Date   NA 133 (L) 06/06/2021   K 4.2 06/06/2021   CO2 23 06/06/2021   GLUCOSE 359 (H) 06/06/2021   BUN 12 06/06/2021   CREATININE 0.94 06/07/2021   CALCIUM 9.1 06/06/2021   GFRNONAA >60 06/07/2021   GFRAA >60 06/04/2019    Lab Results  Component Value Date   ALT 23 06/06/2021   AST 18 06/06/2021   ALKPHOS 77 06/06/2021   BILITOT 1.0 06/06/2021    No results found for: CRP  No results found for: ESRSEDRATE  I have reviewed the micro and lab results in Epic.  Imaging: No results found.   Imaging independently reviewed in Epic.  Vedia Coffer for Infectious Disease [facility] [phone] pager 06/08/2021, 11:58 AM

## 2021-06-08 NOTE — Plan of Care (Signed)
  Problem: Health Behavior/Discharge Planning: Goal: Ability to manage health-related needs will improve Outcome: Progressing   Problem: Pain Managment: Goal: General experience of comfort will improve Outcome: Progressing   Problem: Skin Integrity: Goal: Risk for impaired skin integrity will decrease Outcome: Progressing   Problem: Activity: Goal: Risk for activity intolerance will decrease Outcome: Progressing

## 2021-06-08 NOTE — Progress Notes (Addendum)
Urology Inpatient Progress Report  Fournier gangrene [N49.3] Fournier's gangrene [N49.3]  Procedure(s): IRRIGATION AND DEBRIDEMENT ABSCESS  3 Days Post-Op   Intv/Subj: No acute events overnight. Patient is without complaint.  Wound culture growing Enterococcus.  Sensitivities pending.  Active Problems:   Essential hypertension   Type 2 diabetes mellitus without complication, without long-term current use of insulin (HCC)   Morbid obesity (HCC)   Scrotal abscess   OSA on CPAP  Current Facility-Administered Medications  Medication Dose Route Frequency Provider Last Rate Last Admin   acetaminophen (TYLENOL) tablet 650 mg  650 mg Oral Q6H PRN Doutova, Anastassia, MD   650 mg at 06/07/21 0945   Or   acetaminophen (TYLENOL) suppository 650 mg  650 mg Rectal Q6H PRN Doutova, Anastassia, MD       Ampicillin-Sulbactam (UNASYN) 3 g in sodium chloride 0.9 % 100 mL IVPB  3 g Intravenous Q6H Jamse Mead, RPH 200 mL/hr at 06/08/21 0839 3 g at 06/08/21 0839   enoxaparin (LOVENOX) injection 40 mg  40 mg Subcutaneous Q24H Narda Bonds, MD   40 mg at 06/07/21 1405   fentaNYL (SUBLIMAZE) injection 50 mcg  50 mcg Intravenous Q2H PRN Therisa Doyne, MD   50 mcg at 06/07/21 1942   insulin aspart (novoLOG) injection 0-9 Units  0-9 Units Subcutaneous TID WC Narda Bonds, MD   7 Units at 06/08/21 0834   insulin aspart (novoLOG) injection 5 Units  5 Units Subcutaneous TID WC Narda Bonds, MD   5 Units at 06/08/21 0835   insulin glargine-yfgn (SEMGLEE) injection 20 Units  20 Units Subcutaneous QHS Narda Bonds, MD   20 Units at 06/07/21 2153   lidocaine-EPINEPHrine (XYLOCAINE W/EPI) 2 %-1:100000 (with pres) injection 30 mL  30 mL Infiltration Once Doutova, Anastassia, MD       nicotine (NICODERM CQ - dosed in mg/24 hours) patch 21 mg  21 mg Transdermal Daily Narda Bonds, MD   21 mg at 06/08/21 0834     Objective: Vital: Vitals:   06/07/21 1324 06/07/21 2023 06/08/21 0400  06/08/21 1041  BP: (!) 145/64 (!) 145/79 (!) 158/86 (!) 167/96  Pulse: 77 80 69 71  Resp: 18 20 20 18   Temp: 98 F (36.7 C) 98.2 F (36.8 C) 98.1 F (36.7 C) 97.8 F (36.6 C)  TempSrc: Oral Oral Oral Oral  SpO2: 97% 97% 96% 97%  Weight:      Height:       I/Os: I/O last 3 completed shifts: In: 2281.3 [P.O.:477; IV Piggyback:1804.3] Out: -   Physical Exam:  General: Patient is in no apparent distress Lungs: Normal respiratory effort, chest expands symmetrically. GI: The abdomen is soft and nontender without mass. Genitourinary: Buried penis.  I retracted the foreskin and it appeared normal.  On the right hemiscrotum the wound is packed.  It is clean.  No evidence of ongoing abscess.  No surrounding fluctuance or crepitus.  Some mild tenderness surrounding with some mild induration as expected Ext: lower extremities symmetric  Lab Results: Recent Labs    06/06/21 0345 06/07/21 0331 06/08/21 0341  WBC 19.5* 13.2* 9.9  HGB 14.4 13.2 13.8  HCT 42.7 39.8 41.9   Recent Labs    06/05/21 1431 06/06/21 0345 06/07/21 0331  NA 135 133*  --   K 3.7 4.2  --   CL 100 99  --   CO2 26 23  --   GLUCOSE 332* 359*  --   BUN 10 12  --  CREATININE 0.75 0.75 0.94  CALCIUM 8.8* 9.1  --    No results for input(s): LABPT, INR in the last 72 hours. No results for input(s): LABURIN in the last 72 hours. Results for orders placed or performed during the hospital encounter of 06/05/21  Culture, blood (routine x 2)     Status: None (Preliminary result)   Collection Time: 06/05/21  4:01 PM   Specimen: BLOOD  Result Value Ref Range Status   Specimen Description   Final    BLOOD BLOOD LEFT FOREARM Performed at [facility], [address], [place], [place] [address]    Special Requests   Final    Blood Culture adequate volume BOTTLES DRAWN AEROBIC AND ANAEROBIC Performed at [facility], [address] [place], [place] [address]    Culture    Final    NO GROWTH 3 DAYS Performed at [facility], [address], [place], [place] [address]    Report Status PENDING  Incomplete  Resp Panel by RT-PCR (Flu A&B, Covid) Nasopharyngeal Swab     Status: None   Collection Time: 06/05/21  4:03 PM   Specimen: Nasopharyngeal Swab; Nasopharyngeal(NP) swabs in vial transport medium  Result Value Ref Range Status   SARS Coronavirus 2 by RT PCR NEGATIVE NEGATIVE Final    Comment: (NOTE) SARS-CoV-2 target nucleic acids are NOT DETECTED.  The SARS-CoV-2 RNA is generally detectable in upper respiratory specimens during the acute phase of infection. The lowest concentration of SARS-CoV-2 viral copies this assay can detect is 138 copies/mL. A negative result does not preclude SARS-Cov-2 infection and should not be used as the sole basis for treatment or other patient management decisions. A negative result may occur with  improper specimen collection/handling, submission of specimen other than nasopharyngeal swab, presence of viral mutation(s) within the areas targeted by this assay, and inadequate number of viral copies(<138 copies/mL). A negative result must be combined with clinical observations, patient history, and epidemiological information. The expected result is Negative.  Fact Sheet for Patients:  BloggerCourse.com  Fact Sheet for Healthcare Providers:  SeriousBroker.it  This test is no t yet approved or cleared by the [place] FDA and  has been authorized for detection and/or diagnosis of SARS-CoV-2 by FDA under an Emergency Use Authorization (EUA). This EUA will remain  in effect (meaning this test can be used) for the duration of the COVID-19 declaration under Section 564(b)(1) of the Act, 21 U.S.C.section 360bbb-3(b)(1), unless the authorization is terminated  or revoked sooner.       Influenza A by PCR NEGATIVE NEGATIVE Final   Influenza B by PCR NEGATIVE  NEGATIVE Final    Comment: (NOTE) The Xpert Xpress SARS-CoV-2/FLU/RSV plus assay is intended as an aid in the diagnosis of influenza from Nasopharyngeal swab specimens and should not be used as a sole basis for treatment. Nasal washings and aspirates are unacceptable for Xpert Xpress SARS-CoV-2/FLU/RSV testing.  Fact Sheet for Patients: BloggerCourse.com  Fact Sheet for Healthcare Providers: SeriousBroker.it  This test is not yet approved or cleared by the [place] FDA and has been authorized for detection and/or diagnosis of SARS-CoV-2 by FDA under an Emergency Use Authorization (EUA). This EUA will remain in effect (meaning this test can be used) for the duration of the COVID-19 declaration under Section 564(b)(1) of the Act, 21 U.S.C. section 360bbb-3(b)(1), unless the authorization is terminated or revoked.  Performed at [facility], [address], [place], [place] [address]   Culture, blood (  routine x 2)     Status: None (Preliminary result)   Collection Time: 06/05/21  4:13 PM   Specimen: BLOOD  Result Value Ref Range Status   Specimen Description   Final    BLOOD BLOOD RIGHT HAND Performed at [facility], [address] [place], [place] [address]    Special Requests   Final    Blood Culture adequate volume BOTTLES DRAWN AEROBIC AND ANAEROBIC Performed at [facility], [address] [place], [place] [address]    Culture   Final    NO GROWTH 3 DAYS Performed at [facility], [address] [place], [place] [address]    Report Status PENDING  Incomplete  Aerobic/Anaerobic Culture w Gram Stain (surgical/deep wound)     Status: None (Preliminary result)   Collection Time: 06/05/21  8:06 PM   Specimen: Wound; Abscess  Result Value Ref Range Status   Specimen Description   Final    WOUND RIGHT SCROTUM Performed at [facility], [address] [place], [place] [address]    Special Requests   Final    NONE Performed at [facility], [address] [place], [place] [address]    Gram Stain   Final    ABUNDANT WBC PRESENT,BOTH PMN AND MONONUCLEAR ABUNDANT GRAM POSITIVE COCCI IN PAIRS AND CHAINS FEW GRAM NEGATIVE RODS    Culture   Final    ABUNDANT ENTEROCOCCUS FAECALIS SUSCEPTIBILITIES TO FOLLOW Performed at [facility], [address], [place], [place] [address]    Report Status PENDING  Incomplete    Studies/Results: No results found.  Assessment: Scrotal abscess Procedure(s): IRRIGATION AND DEBRIDEMENT ABSCESS, 3 Days Post-Op  doing well.  Plan: Follow-up final sensitivities to determine an oral course of antibiotics for a total of 10 days unless otherwise specified by infectious disease.  Continue once to twice daily wet-to-dry dressing changes   Modena Slater, MD Urology 06/08/2021, 11:47 AM

## 2021-06-08 NOTE — Progress Notes (Signed)
PROGRESS NOTE    Robert Lewis  FUX:[id number] DOB: December 16, 1965 DOA: 06/05/2021 PCP: Marin Comment, FNP   Chief Complain: Scrotal swelling/pain  Brief Narrative: Patient is a 55 year old male with history of hypertension, diabetes type 2, obesity, OSA on CPAP who presented with scrotal swelling, pain.  Concern for Fournier's gangrene on presentation.  Started on empiric antibiotics.  Urology consulted and underwent I&D .  Currently on Unasyn.  ID also following.  Assessment & Plan:   Active Problems:   Essential hypertension   Type 2 diabetes mellitus without complication, without long-term current use of insulin (HCC)   Morbid obesity (HCC)   Scrotal abscess   OSA on CPAP   Right scrotal/inguinal abscess:Presented with scrotal swelling, pain.  Started on vancomycin, clinda, and Zosyn on admission.  Urology consulted and and he underwent I&D on 9/14.  Initial concern was for 4 days gangrene but there was no evidence of gangrenous infection.  Blood cultures have not shown any growth.  Aerobic/anaerobic cultures showing Enterococcus fecalis, gram-negative rods.  Currently on Unasyn.  ID following.  Continue to follow the final culture report.  Antibiotics will be changed to oral for 10 days from date of  I and D after identification of the organisms.  Diabetes type 2: A1c of 10.4.  Started on long-acting and sliding scale insulin.  Diabetic coordinator following.  He takes metformin/Ozempic at home.  Will follow diabetic coordinator recommendation for discharge.  Hypertension: Currently not on any medications.  Currently blood pressure stable without any blood pressure pills.  OSA: On CPAP at night  Morbid obesity: BMI 46.2.  We recommend regular exercise and healthy diet.         DVT prophylaxis:Lovenox Code Status: Full Family Communication:  Status is: Inpatient  Remains inpatient appropriate because:Inpatient level of care appropriate due to severity of  illness  Dispo: The patient is from: Home              Anticipated d/c is to: Home              Patient currently is not medically stable to d/c.   Difficult to place patient No     Consultants: Urology,ID  Procedures:I and D  Antimicrobials:  Anti-infectives (From admission, onward)    Start     Dose/Rate Route Frequency Ordered Stop   06/07/21 2200  vancomycin (VANCOREADY) IVPB 1250 mg/250 mL  Status:  Discontinued        1,250 mg 83.3 mL/hr over 180 Minutes Intravenous Every 8 hours 06/07/21 1422 06/07/21 1624   06/07/21 2200  Ampicillin-Sulbactam (UNASYN) 3 g in sodium chloride 0.9 % 100 mL IVPB        3 g 200 mL/hr over 30 Minutes Intravenous Every 6 hours 06/07/21 1649     06/06/21 0600  piperacillin-tazobactam (ZOSYN) IVPB 3.375 g  Status:  Discontinued        3.375 g 12.5 mL/hr over 240 Minutes Intravenous Every 8 hours 06/05/21 1724 06/05/21 1951   06/06/21 0200  vancomycin (VANCOREADY) IVPB 2000 mg/400 mL  Status:  Discontinued        2,000 mg 200 mL/hr over 120 Minutes Intravenous Every 8 hours 06/05/21 1724 06/05/21 1951   06/06/21 0200  vancomycin (VANCOREADY) IVPB 1250 mg/250 mL  Status:  Discontinued        1,250 mg 166.7 mL/hr over 90 Minutes Intravenous Every 8 hours 06/05/21 1951 06/05/21 2005   06/06/21 0200  clindamycin (CLEOCIN) IVPB 900 mg  Status:  Discontinued  900 mg 100 mL/hr over 30 Minutes Intravenous Every 8 hours 06/05/21 2001 06/07/21 1624   06/06/21 0200  vancomycin (VANCOREADY) IVPB 1250 mg/250 mL  Status:  Discontinued        1,250 mg 83.3 mL/hr over 180 Minutes Intravenous Every 8 hours 06/05/21 2005 06/07/21 1422   06/05/21 2300  piperacillin-tazobactam (ZOSYN) IVPB 3.375 g  Status:  Discontinued        3.375 g 12.5 mL/hr over 240 Minutes Intravenous Every 8 hours 06/05/21 1951 06/07/21 1635   06/05/21 1645  vancomycin (VANCOREADY) IVPB 2000 mg/400 mL  Status:  Discontinued        2,000 mg 200 mL/hr over 120 Minutes Intravenous   Once 06/05/21 1633 06/05/21 1634   06/05/21 1645  vancomycin (VANCOCIN) IVPB 1000 mg/200 mL premix        1,000 mg 200 mL/hr over 60 Minutes Intravenous  Once 06/05/21 1634 06/05/21 1954   06/05/21 1630  vancomycin (VANCOCIN) IVPB 1000 mg/200 mL premix        1,000 mg 200 mL/hr over 60 Minutes Intravenous  Once 06/05/21 1623 06/05/21 1842   06/05/21 1630  piperacillin-tazobactam (ZOSYN) IVPB 3.375 g        3.375 g 100 mL/hr over 30 Minutes Intravenous  Once 06/05/21 1623 06/05/21 1706   06/05/21 1630  clindamycin (CLEOCIN) IVPB 600 mg  Status:  Discontinued        600 mg 100 mL/hr over 30 Minutes Intravenous Every 8 hours 06/05/21 1623 06/05/21 2001       Subjective:  Patient seen and examined the bedside this morning.  Hemodynamically stable.  Comfortable, sitting on the chair.  Denies any new complaints.  Pain well controlled on the scrotum and inguinal area.  Objective: Vitals:   06/07/21 1324 06/07/21 2023 06/08/21 0400 06/08/21 1041  BP: (!) 145/64 (!) 145/79 (!) 158/86 (!) 167/96  Pulse: 77 80 69 71  Resp: 18 20 20 18   Temp: 98 F (36.7 C) 98.2 F (36.8 C) 98.1 F (36.7 C) 97.8 F (36.6 C)  TempSrc: Oral Oral Oral Oral  SpO2: 97% 97% 96% 97%  Weight:      Height:        Intake/Output Summary (Last 24 hours) at 06/08/2021 1258 Last data filed at 06/08/2021 0600 Gross per 24 hour  Intake 1046.68 ml  Output --  Net 1046.68 ml   Filed Weights   06/05/21 1237  Weight: (!) 163.3 kg    Examination:  General exam: Overall comfortable, not in distress, morbidly obese HEENT: PERRL Respiratory system:  no wheezes or crackles  Cardiovascular system: S1 & S2 heard, RRR.  Gastrointestinal system: Abdomen is nondistended, soft and nontender. Central nervous system: Alert and oriented Extremities: No edema, no clubbing ,no cyanosis Skin: No rashes, no ulcers,no icterus   GU: Surgical wound on the right inguinal area,no significant discharge    Data Reviewed: I have  personally reviewed following labs and imaging studies  CBC: Recent Labs  Lab 06/05/21 1431 06/06/21 0345 06/07/21 0331 06/08/21 0341  WBC 16.2* 19.5* 13.2* 9.9  NEUTROABS 12.2* 17.7*  --   --   HGB 14.1 14.4 13.2 13.8  HCT 42.2 42.7 39.8 41.9  MCV 88.1 89.9 90.7 90.7  PLT 227 242 234 261   Basic Metabolic Panel: Recent Labs  Lab 06/05/21 1431 06/06/21 0345 06/07/21 0331  NA 135 133*  --   K 3.7 4.2  --   CL 100 99  --   CO2 26 23  --  GLUCOSE 332* 359*  --   BUN 10 12  --   CREATININE 0.75 0.75 0.94  CALCIUM 8.8* 9.1  --   MG  --  1.8  --   PHOS  --  4.2  --    GFR: Estimated Creatinine Clearance: 147.3 mL/min (by C-G formula based on SCr of 0.94 mg/dL). Liver Function Tests: Recent Labs  Lab 06/05/21 2146 06/06/21 0345  AST 13* 18  ALT 19 23  ALKPHOS 76 77  BILITOT 0.8 1.0  PROT 6.9 7.1  ALBUMIN 3.8 3.5   No results for input(s): LIPASE, AMYLASE in the last 168 hours. No results for input(s): AMMONIA in the last 168 hours. Coagulation Profile: No results for input(s): INR, PROTIME in the last 168 hours. Cardiac Enzymes: No results for input(s): CKTOTAL, CKMB, CKMBINDEX, TROPONINI in the last 168 hours. BNP (last 3 results) No results for input(s): PROBNP in the last 8760 hours. HbA1C: Recent Labs    06/05/21 2146  HGBA1C 10.4*   CBG: Recent Labs  Lab 06/07/21 1134 06/07/21 1626 06/07/21 2119 06/08/21 0739 06/08/21 1225  GLUCAP 271* 216* 205* 325* 149*   Lipid Profile: Recent Labs    06/06/21 0345  CHOL 186  HDL 58  LDLCALC 115*  TRIG 66  CHOLHDL 3.2   Thyroid Function Tests: Recent Labs    06/06/21 0345  TSH 0.464   Anemia Panel: No results for input(s): VITAMINB12, FOLATE, FERRITIN, TIBC, IRON, RETICCTPCT in the last 72 hours. Sepsis Labs: Recent Labs  Lab 06/05/21 1431 06/05/21 2146  PROCALCITON  --  <0.10  LATICACIDVEN 1.3  --     Recent Results (from the past 240 hour(s))  Culture, blood (routine x 2)      Status: None (Preliminary result)   Collection Time: 06/05/21  4:01 PM   Specimen: BLOOD  Result Value Ref Range Status   Specimen Description   Final    BLOOD BLOOD LEFT FOREARM Performed at [facility], [address] [place], [place] [address]    Special Requests   Final    Blood Culture adequate volume BOTTLES DRAWN AEROBIC AND ANAEROBIC Performed at [facility], [address] [place], [place] [address]    Culture   Final    NO GROWTH 3 DAYS Performed at [facility], [address], [place], [place] [address]    Report Status PENDING  Incomplete  Resp Panel by RT-PCR (Flu A&B, Covid) Nasopharyngeal Swab     Status: None   Collection Time: 06/05/21  4:03 PM   Specimen: Nasopharyngeal Swab; Nasopharyngeal(NP) swabs in vial transport medium  Result Value Ref Range Status   SARS Coronavirus 2 by RT PCR NEGATIVE NEGATIVE Final    Comment: (NOTE) SARS-CoV-2 target nucleic acids are NOT DETECTED.  The SARS-CoV-2 RNA is generally detectable in upper respiratory specimens during the acute phase of infection. The lowest concentration of SARS-CoV-2 viral copies this assay can detect is 138 copies/mL. A negative result does not preclude SARS-Cov-2 infection and should not be used as the sole basis for treatment or other patient management decisions. A negative result may occur with  improper specimen collection/handling, submission of specimen other than nasopharyngeal swab, presence of viral mutation(s) within the areas targeted by this assay, and inadequate number of viral copies(<138 copies/mL). A negative result must be combined with clinical observations, patient history, and epidemiological information. The expected result is Negative.  Fact Sheet for Patients:  BloggerCourse.com  Fact Sheet for Healthcare Providers:  SeriousBroker.it  This test is no t yet approved or  cleared by the [place] and  has been authorized for detection and/or diagnosis of SARS-CoV-2 by FDA under an Emergency Use Authorization (EUA). This EUA will remain  in effect (meaning this test can be used) for the duration of the COVID-19 declaration under Section 564(b)(1) of the Act, 21 U.S.C.section 360bbb-3(b)(1), unless the authorization is terminated  or revoked sooner.       Influenza A by PCR NEGATIVE NEGATIVE Final   Influenza B by PCR NEGATIVE NEGATIVE Final    Comment: (NOTE) The Xpert Xpress SARS-CoV-2/FLU/RSV plus assay is intended as an aid in the diagnosis of influenza from Nasopharyngeal swab specimens and should not be used as a sole basis for treatment. Nasal washings and aspirates are unacceptable for Xpert Xpress SARS-CoV-2/FLU/RSV testing.  Fact Sheet for Patients: BloggerCourse.com  Fact Sheet for Healthcare Providers: SeriousBroker.it  This test is not yet approved or cleared by the [place] FDA and has been authorized for detection and/or diagnosis of SARS-CoV-2 by FDA under an Emergency Use Authorization (EUA). This EUA will remain in effect (meaning this test can be used) for the duration of the COVID-19 declaration under Section 564(b)(1) of the Act, 21 U.S.C. section 360bbb-3(b)(1), unless the authorization is terminated or revoked.  Performed at [facility], [address] [place], [place] [address]   Culture, blood (routine x 2)     Status: None (Preliminary result)   Collection Time: 06/05/21  4:13 PM   Specimen: BLOOD  Result Value Ref Range Status   Specimen Description   Final    BLOOD BLOOD RIGHT HAND Performed at [facility], [address] [place], [place] [address]    Special Requests   Final    Blood Culture adequate volume BOTTLES DRAWN AEROBIC AND ANAEROBIC Performed at [facility], [address], [place], [place] [address]    Culture   Final    NO GROWTH 3 DAYS Performed at [facility], [address] [place], [place] [address]    Report Status PENDING  Incomplete  Aerobic/Anaerobic Culture w Gram Stain (surgical/deep wound)     Status: None (Preliminary result)   Collection Time: 06/05/21  8:06 PM   Specimen: Wound; Abscess  Result Value Ref Range Status   Specimen Description   Final    WOUND RIGHT SCROTUM Performed at [facility], [address], [place], [place] [address]    Special Requests   Final    NONE Performed at [facility], [address], [place], [place] [address]    Gram Stain   Final    ABUNDANT WBC PRESENT,BOTH PMN AND MONONUCLEAR ABUNDANT GRAM POSITIVE COCCI IN PAIRS AND CHAINS FEW GRAM NEGATIVE RODS Performed at [facility], [address] [place], [place] [address]    Culture ABUNDANT ENTEROCOCCUS FAECALIS  Final   Report Status PENDING  Incomplete   Organism ID, Bacteria ENTEROCOCCUS FAECALIS  Final      Susceptibility   Enterococcus faecalis - MIC*    AMPICILLIN <=2 SENSITIVE Sensitive     VANCOMYCIN 2 SENSITIVE Sensitive     GENTAMICIN SYNERGY SENSITIVE Sensitive     * ABUNDANT ENTEROCOCCUS FAECALIS         Radiology Studies: No results found.      Scheduled Meds:  enoxaparin (LOVENOX) injection  40 mg Subcutaneous Q24H   insulin aspart  0-9 Units Subcutaneous TID WC  insulin aspart  5 Units Subcutaneous TID WC   insulin glargine-yfgn  20 Units Subcutaneous QHS   lidocaine-EPINEPHrine  30 mL Infiltration Once   nicotine  21 mg Transdermal Daily   Continuous Infusions:  ampicillin-sulbactam (UNASYN) IV 3 g (06/08/21 0839)     LOS: 3 days    Time spent: More than 50% of that time was spent in counseling and/or coordination of care.      Burnadette Pop, MD [facility] P9/17/2022, 12:58 PM

## 2021-06-09 DIAGNOSIS — N492 Inflammatory disorders of scrotum: Secondary | ICD-10-CM | POA: Diagnosis not present

## 2021-06-09 LAB — AEROBIC/ANAEROBIC CULTURE W GRAM STAIN (SURGICAL/DEEP WOUND)

## 2021-06-09 LAB — GLUCOSE, CAPILLARY
Glucose-Capillary: 172 mg/dL — ABNORMAL HIGH (ref 70–99)
Glucose-Capillary: 168 mg/dL — ABNORMAL HIGH (ref 70–99)

## 2021-06-09 MED ORDER — INSULIN GLARGINE-YFGN 100 UNIT/ML ~~LOC~~ SOLN: 20.0000 [IU] | Freq: Every day | SUBCUTANEOUS | Status: DC

## 2021-06-09 MED ORDER — AMOXICILLIN-POT CLAVULANATE 875-125 MG PO TABS: 1.0000 | ORAL_TABLET | Freq: Two times a day (BID) | ORAL | 0 refills | Status: AC

## 2021-06-09 MED ORDER — AMLODIPINE BESYLATE 10 MG PO TABS
10.0000 mg | ORAL_TABLET | Freq: Every day | ORAL | Status: DC
  Administered 2021-06-09: 10 mg via ORAL
  Filled 2021-06-09: qty 1

## 2021-06-09 MED ORDER — AMLODIPINE BESYLATE 10 MG PO TABS
10.0000 mg | ORAL_TABLET | Freq: Every day | ORAL | 1 refills | Status: DC
  Filled 2021-06-09: qty 30, 30d supply, fill #0

## 2021-06-09 MED ORDER — AMOXICILLIN-POT CLAVULANATE 875-125 MG PO TABS
1.0000 | ORAL_TABLET | Freq: Two times a day (BID) | ORAL | 0 refills | Status: DC
  Filled 2021-06-09: qty 12, 6d supply, fill #0

## 2021-06-09 MED ORDER — OZEMPIC (0.25 OR 0.5 MG/DOSE) 2 MG/1.5ML ~~LOC~~ SOPN
0.2500 mg | PEN_INJECTOR | SUBCUTANEOUS | 0 refills | Status: DC
  Filled 2021-06-09: qty 3, 112d supply, fill #0

## 2021-06-09 MED ORDER — AMLODIPINE BESYLATE 10 MG PO TABS: 10.0000 mg | ORAL_TABLET | Freq: Every day | ORAL | 1 refills | Status: DC

## 2021-06-09 MED ORDER — BLOOD GLUCOSE MONITOR KIT
PACK | 0 refills | Status: DC
Start: 2021-06-09 — End: 2021-06-09
  Filled 2021-06-09: qty 1, fill #0

## 2021-06-09 MED ORDER — ENOXAPARIN SODIUM 80 MG/0.8ML IJ SOSY: 80.0000 mg | PREFILLED_SYRINGE | INTRAMUSCULAR | Status: DC

## 2021-06-09 MED ORDER — BLOOD PRESSURE KIT: 1.0000 | PACK | Freq: Two times a day (BID) | 0 refills | Status: DC

## 2021-06-09 MED ORDER — OZEMPIC (0.25 OR 0.5 MG/DOSE) 2 MG/1.5ML ~~LOC~~ SOPN: 0.2500 mg | PEN_INJECTOR | SUBCUTANEOUS | 0 refills | Status: DC

## 2021-06-09 MED ORDER — METFORMIN HCL 500 MG PO TABS: 500.0000 mg | ORAL_TABLET | Freq: Two times a day (BID) | ORAL | 1 refills | Status: DC

## 2021-06-09 MED ORDER — BLOOD PRESSURE KIT
1.0000 | PACK | Freq: Two times a day (BID) | 0 refills | Status: DC
  Filled 2021-06-09: qty 1, fill #0

## 2021-06-09 MED ORDER — BLOOD GLUCOSE MONITOR KIT: PACK | 0 refills | Status: AC

## 2021-06-09 MED ORDER — METFORMIN HCL 500 MG PO TABS
500.0000 mg | ORAL_TABLET | Freq: Two times a day (BID) | ORAL | 1 refills | Status: DC
  Filled 2021-06-09: qty 60, 30d supply, fill #0

## 2021-06-09 NOTE — Discharge Summary (Addendum)
Physician Discharge Summary  Robert Lewis KDT:[id number] DOB: Apr 30, 1966 DOA: 06/05/2021  PCP: Suzan Garibaldi, FNP  Admit date: 06/05/2021 Discharge date: 06/09/2021  Admitted From: Home Disposition:  Home  Discharge Condition:Stable CODE STATUS:FULL Diet recommendation:Carbohydrate consistent  Brief/Interim Summary: Patient is a 55 year old male with history of hypertension, diabetes type 2, obesity, OSA on CPAP who presented with scrotal swelling, pain.  Concern for Fournier's gangrene on presentation.  Started on empiric antibiotics.  Urology consulted and underwent I&D .  Currently on Unasyn.  ID also following.  Wound culture grew mixed organisms.  ID recommended to continue antibiotics for total of 10 days duration, he has been discharged with Augmentin.  Following problems were addressed during his hospitalization:  Right scrotal/inguinal abscess:Presented with scrotal swelling, pain.  Started on vancomycin, clinda, and Zosyn on admission.  Urology consulted and and he underwent I&D on 9/14.  Initial concern was for 4 days gangrene but there was no evidence of gangrenous infection.  Blood cultures have not shown any growth.  Aerobic/anaerobic cultures showing Enterococcus fecalis, gram-negative rods.  Currently on Unasyn.  ID following.   Antibiotics  changed to oral for 10 days from date of  I and D .   Diabetes type 2: A1c of 10.4.  Started on long-acting and sliding scale insulin.  Diabetic coordinator following.  He was taking metformin/Ozempic at home a year ago but not now.  Diabetic coordinator recommended to discharge him on Ozempic and metformin   Hypertension: Currently not on any medications at home.  Blood pressure constantly in the high range.  Started on amlodipine  OSA: On CPAP at night   Morbid obesity: BMI 46.2.  We recommend regular exercise and healthy diet.    Discharge Diagnoses:  Active Problems:   Essential hypertension   Type 2 diabetes mellitus  without complication, without long-term current use of insulin (HCC)   Morbid obesity (HCC)   Scrotal abscess   OSA on CPAP    Discharge Instructions  Discharge Instructions     Diet Carb Modified   Complete by: As directed    Discharge instructions   Complete by: As directed    1)Please take prescribed medication as instructed 2)Monitor your blood sugars and blood pressure at home. 3)Follow up with your PCP in a week. 4) follow-up with urology as an outpatient in 1 to 2 weeks.  Name and number the provider has been addressed.   Increase activity slowly   Complete by: As directed    No wound care   Complete by: As directed       Allergies as of 06/09/2021   No Known Allergies      Medication List     TAKE these medications    amLODipine 10 MG tablet Commonly known as: NORVASC Take 1 tablet (10 mg total) by mouth daily. Start taking on: June 10, 2021   amoxicillin-clavulanate 875-125 MG tablet Commonly known as: Augmentin Take 1 tablet by mouth 2 (two) times daily for 6 days.   blood glucose meter kit and supplies Kit Dispense based on patient and insurance preference. Use up to four times daily as directed.   Blood Pressure Kit 1 each by Does not apply route 2 (two) times daily.   metFORMIN 500 MG tablet Commonly known as: Glucophage Take 1 tablet (500 mg total) by mouth 2 (two) times daily with a meal.   Ozempic (0.25 or 0.5 MG/DOSE) 2 MG/1.5ML Sopn Generic drug: Semaglutide(0.25 or 0.5MG/DOS) Inject 0.25 mg into the skin  once a week. Take 0.25 mg a week for 4 weeks then start taking 0.5 mg weekly        Follow-up Information     [name], MD. Schedule an appointment as soon as possible for a visit in 1 week(s).   Specialty: Urology Contact information: [place] [address] [phone]                No Known Allergies  Consultations: Urology, ID  Procedures/Studies: CT PELVIS W CONTRAST  Result Date:  06/05/2021 CLINICAL DATA:  Hernia, right scrotal/inguinal abscess EXAM: CT PELVIS WITH CONTRAST TECHNIQUE: Multidetector CT imaging of the pelvis was performed using the standard protocol following the bolus administration of intravenous contrast. CONTRAST:  19m OMNIPAQUE IOHEXOL 350 MG/ML SOLN COMPARISON:  None. FINDINGS: Urinary Tract: The bladder lumen measures greater than simple fluid attenuation. No focal lesions are identified. Bowel:  The imaged bowel is unremarkable. Vascular/Lymphatic: The imaged vasculature is unremarkable. There are a few prominent bilateral inguinal lymph nodes. There is no pathologic lymphadenopathy in the pelvis. Reproductive:  The prostate and seminal vesicles are unremarkable. Other: There is significant inflammatory change in the right groin extending into the scrotum. There are bilateral hydroceles. Musculoskeletal: Left femur hardware is partially imaged. There is no acute osseous abnormality in the pelvis. IMPRESSION: 1. Findings above consistent with right scrotal/groin cellulitis with bilateral hydroceles. There are small locules of gas in the right groin; infection with a gas-forming organism/Fournier's gangrene cannot be excluded. 2. The bladder lumen measures greater than simple fluid attenuation. This may reflect blood or contrast from a prior imaging study. Correlate with urinalysis as indicated. These results were called by telephone at the time of interpretation on 06/05/2021 at 3:48 pm to [facility], who verbally acknowledged these results. Electronically Signed   By: PValetta MoleM.D.   On: 06/05/2021 15:49      Subjective: Patient seen and examined at bedside this morning.  Medically stable for discharge.   Discharge Exam: Vitals:   06/09/21 0547 06/09/21 0803  BP: (!) 163/77 (!) 169/82  Pulse: 68 65  Resp: 18 18  Temp: 97.8 F (36.6 C) 97.6 F (36.4 C)  SpO2: 100% 98%   Vitals:   06/08/21 1514 06/08/21 2057 06/09/21 0547  06/09/21 0803  BP: (!) 159/88 (!) 141/85 (!) 163/77 (!) 169/82  Pulse: 76 77 68 65  Resp:  _0 Temp:  98.2 F (36.8 C) 97.8 F (36.6 C) 97.6 F (36.4 C)  TempSrc:  Oral Oral Oral  SpO2:  98% 100% 98%  Weight:      Height:        General: Pt is alert, awake, not in acute distress Cardiovascular: RRR, S1/S2 +, no rubs, no gallops Respiratory: CTA bilaterally, no wheezing, no rhonchi Abdominal: Soft, NT, ND, bowel sounds + Extremities: no edema, no cyanosis    The results of significant diagnostics from this hospitalization (including imaging, microbiology, ancillary and laboratory) are listed below for reference.     Microbiology: Recent Results (from the past 240 hour(s))  Culture, blood (routine x 2)     Status: None (Preliminary result)   Collection Time: 06/05/21  4:01 PM   Specimen: BLOOD  Result Value Ref Range Status   Specimen Description   Final    BLOOD BLOOD LEFT FOREARM Performed at [facility], [address] [place] [address]   Special Requests   Final    Blood Culture  adequate volume BOTTLES DRAWN AEROBIC AND ANAEROBIC Performed at [facility], [address], [place], [place] [address]    Culture   Final    NO GROWTH 4 DAYS Performed at [facility], [place] [address] [place], [place] [address]    Report Status PENDING  Incomplete  Resp Panel by RT-PCR (Flu A&B, Covid) Nasopharyngeal Swab     Status: None   Collection Time: 06/05/21  4:03 PM   Specimen: Nasopharyngeal Swab; Nasopharyngeal(NP) swabs in vial transport medium  Result Value Ref Range Status   SARS Coronavirus 2 by RT PCR NEGATIVE NEGATIVE Final    Comment: (NOTE) SARS-CoV-2 target nucleic acids are NOT DETECTED.  The SARS-CoV-2 RNA is generally detectable in upper respiratory specimens during the acute phase of infection. The lowest concentration of SARS-CoV-2 viral copies this assay can detect is 138 copies/mL. A negative  result does not preclude SARS-Cov-2 infection and should not be used as the sole basis for treatment or other patient management decisions. A negative result may occur with  improper specimen collection/handling, submission of specimen other than nasopharyngeal swab, presence of viral mutation(s) within the areas targeted by this assay, and inadequate number of viral copies(<138 copies/mL). A negative result must be combined with clinical observations, patient history, and epidemiological information. The expected result is Negative.  Fact Sheet for Patients:  EntrepreneurPulse.com.au  Fact Sheet for Healthcare Providers:  IncredibleEmployment.be  This test is no t yet approved or cleared by the [place] FDA and  has been authorized for detection and/or diagnosis of SARS-CoV-2 by FDA under an Emergency Use Authorization (EUA). This EUA will remain  in effect (meaning this test can be used) for the duration of the COVID-19 declaration under Section 564(b)(1) of the Act, 21 U.S.C.section 360bbb-3(b)(1), unless the authorization is terminated  or revoked sooner.       Influenza A by PCR NEGATIVE NEGATIVE Final   Influenza B by PCR NEGATIVE NEGATIVE Final    Comment: (NOTE) The Xpert Xpress SARS-CoV-2/FLU/RSV plus assay is intended as an aid in the diagnosis of influenza from Nasopharyngeal swab specimens and should not be used as a sole basis for treatment. Nasal washings and aspirates are unacceptable for Xpert Xpress SARS-CoV-2/FLU/RSV testing.  Fact Sheet for Patients: EntrepreneurPulse.com.au  Fact Sheet for Healthcare Providers: IncredibleEmployment.be  This test is not yet approved or cleared by the [place] FDA and has been authorized for detection and/or diagnosis of SARS-CoV-2 by FDA under an Emergency Use Authorization (EUA). This EUA will remain in effect (meaning this test can be used)  for the duration of the COVID-19 declaration under Section 564(b)(1) of the Act, 21 U.S.C. section 360bbb-3(b)(1), unless the authorization is terminated or revoked.  Performed at [facility], [address] [place], [place] [address]   Culture, blood (routine x 2)     Status: None (Preliminary result)   Collection Time: 06/05/21  4:13 PM   Specimen: BLOOD  Result Value Ref Range Status   Specimen Description   Final    BLOOD BLOOD RIGHT HAND Performed at [facility], [address] [place], [place] [address]    Special Requests   Final    Blood Culture adequate volume BOTTLES DRAWN AEROBIC AND ANAEROBIC Performed at [facility], [address] [place], [place] [address]    Culture   Final    NO GROWTH 4 DAYS Performed at [facility], [place] [address], [place], [place] [address]  Report Status PENDING  Incomplete  Aerobic/Anaerobic Culture w Gram Stain (surgical/deep wound)     Status: None (Preliminary result)   Collection Time: 06/05/21  8:06 PM   Specimen: Wound; Abscess  Result Value Ref Range Status   Specimen Description   Final    WOUND RIGHT SCROTUM Performed at [place] [address] [place], [place] [address]    Special Requests   Final    NONE Performed at [facility], [place] [address], [place], [place] [address]    Gram Stain   Final    ABUNDANT WBC PRESENT,BOTH PMN AND MONONUCLEAR ABUNDANT GRAM POSITIVE COCCI IN PAIRS AND CHAINS FEW GRAM NEGATIVE RODS Performed at [facility], [place] [address], [place],  [address]    Culture   Final    ABUNDANT ENTEROCOCCUS FAECALIS MIXED ANAEROBIC FLORA PRESENT.  CALL LAB IF FURTHER IID REQUIRED.    Report Status PENDING  Incomplete   Organism ID, Bacteria ENTEROCOCCUS FAECALIS  Final      Susceptibility   Enterococcus faecalis - MIC*    AMPICILLIN <=2 SENSITIVE Sensitive     VANCOMYCIN 2  SENSITIVE Sensitive     GENTAMICIN SYNERGY SENSITIVE Sensitive     * ABUNDANT ENTEROCOCCUS FAECALIS     Labs: BNP (last 3 results) No results for input(s): BNP in the last 8760 hours. Basic Metabolic Panel: Recent Labs  Lab 06/05/21 1431 06/06/21 0345 06/07/21 0331  NA 135 133*  --   K 3.7 4.2  --   CL 100 99  --   CO2 26 23  --   GLUCOSE 332* 359*  --   BUN 10 12  --   CREATININE 0.75 0.75 0.94  CALCIUM 8.8* 9.1  --   MG  --  1.8  --   PHOS  --  4.2  --    Liver Function Tests: Recent Labs  Lab 06/05/21 2146 06/06/21 0345  AST 13* 18  ALT 19 23  ALKPHOS 76 77  BILITOT 0.8 1.0  PROT 6.9 7.1  ALBUMIN 3.8 3.5   No results for input(s): LIPASE, AMYLASE in the last 168 hours. No results for input(s): AMMONIA in the last 168 hours. CBC: Recent Labs  Lab 06/05/21 1431 06/06/21 0345 06/07/21 0331 06/08/21 0341  WBC 16.2* 19.5* 13.2* 9.9  NEUTROABS 12.2* 17.7*  --   --   HGB 14.1 14.4 13.2 13.8  HCT 42.2 42.7 39.8 41.9  MCV 88.1 89.9 90.7 90.7  PLT 227 242 234 261   Cardiac Enzymes: No results for input(s): CKTOTAL, CKMB, CKMBINDEX, TROPONINI in the last 168 hours. BNP: Invalid input(s): POCBNP CBG: Recent Labs  Lab 06/08/21 0739 06/08/21 1225 06/08/21 1828 06/08/21 2055 06/09/21 0800  GLUCAP 325* 149* 128* 238* 172*   D-Dimer No results for input(s): DDIMER in the last 72 hours. Hgb A1c No results for input(s): HGBA1C in the last 72 hours. Lipid Profile No results for input(s): CHOL, HDL, LDLCALC, TRIG, CHOLHDL, LDLDIRECT in the last 72 hours. Thyroid function studies No results for input(s): TSH, T4TOTAL, T3FREE, THYROIDAB in the last 72 hours.  Invalid input(s): FREET3 Anemia work up No results for input(s): VITAMINB12, FOLATE, FERRITIN, TIBC, IRON, RETICCTPCT in the last 72 hours. Urinalysis    Component Value Date/Time   COLORURINE YELLOW 06/05/2021 1431   APPEARANCEUR CLEAR 06/05/2021 1431   LABSPEC >1.046 (H) 06/05/2021 1431    PHURINE 5.5 06/05/2021 1431   GLUCOSEU >1,000 (A) 06/05/2021 1431  HGBUR NEGATIVE 06/05/2021 [place] 06/05/2021 1431   KETONESUR NEGATIVE 06/05/2021 1431   PROTEINUR TRACE (A) 06/05/2021 1431   NITRITE NEGATIVE 06/05/2021 1431   LEUKOCYTESUR NEGATIVE 06/05/2021 1431   Sepsis Labs Invalid input(s): PROCALCITONIN,  WBC,  LACTICIDVEN Microbiology Recent Results (from the past 240 hour(s))  Culture, blood (routine x 2)     Status: None (Preliminary result)   Collection Time: 06/05/21  4:01 PM   Specimen: BLOOD  Result Value Ref Range Status   Specimen Description   Final    BLOOD BLOOD LEFT FOREARM Performed at [facility], [address] [place], [place] [address]    Special Requests   Final    Blood Culture adequate volume BOTTLES DRAWN AEROBIC AND ANAEROBIC Performed at [facility], [address], [place], [place] [address]    Culture   Final    NO GROWTH 4 DAYS Performed at [facility], [place] [address] [place], [place] [address]    Report Status PENDING  Incomplete  Resp Panel by RT-PCR (Flu A&B, Covid) Nasopharyngeal Swab     Status: None   Collection Time: 06/05/21  4:03 PM   Specimen: Nasopharyngeal Swab; Nasopharyngeal(NP) swabs in vial transport medium  Result Value Ref Range Status   SARS Coronavirus 2 by RT PCR NEGATIVE NEGATIVE Final    Comment: (NOTE) SARS-CoV-2 target nucleic acids are NOT DETECTED.  The SARS-CoV-2 RNA is generally detectable in upper respiratory specimens during the acute phase of infection. The lowest concentration of SARS-CoV-2 viral copies this assay can detect is 138 copies/mL. A negative result does not preclude SARS-Cov-2 infection and should not be used as the sole basis for treatment or other patient management decisions. A negative result may occur with  improper specimen collection/handling, submission of specimen other than nasopharyngeal swab, presence of viral  mutation(s) within the areas targeted by this assay, and inadequate number of viral copies(<138 copies/mL). A negative result must be combined with clinical observations, patient history, and epidemiological information. The expected result is Negative.  Fact Sheet for Patients:  EntrepreneurPulse.com.au  Fact Sheet for Healthcare Providers:  IncredibleEmployment.be  This test is no t yet approved or cleared by the [place] FDA and  has been authorized for detection and/or diagnosis of SARS-CoV-2 by FDA under an Emergency Use Authorization (EUA). This EUA will remain  in effect (meaning this test can be used) for the duration of the COVID-19 declaration under Section 564(b)(1) of the Act, 21 U.S.C.section 360bbb-3(b)(1), unless the authorization is terminated  or revoked sooner.       Influenza A by PCR NEGATIVE NEGATIVE Final   Influenza B by PCR NEGATIVE NEGATIVE Final    Comment: (NOTE) The Xpert Xpress SARS-CoV-2/FLU/RSV plus assay is intended as an aid in the diagnosis of influenza from Nasopharyngeal swab specimens and should not be used as a sole basis for treatment. Nasal washings and aspirates are unacceptable for Xpert Xpress SARS-CoV-2/FLU/RSV testing.  Fact Sheet for Patients: EntrepreneurPulse.com.au  Fact Sheet for Healthcare Providers: IncredibleEmployment.be  This test is not yet approved or cleared by the [place] FDA and has been authorized for detection and/or diagnosis of SARS-CoV-2 by FDA under an Emergency Use Authorization (EUA). This EUA will remain in effect (meaning this test can be used) for the duration of the COVID-19 declaration under Section 564(b)(1) of the Act, 21 U.S.C. section 360bbb-3(b)(1), unless the authorization is terminated or revoked.  Performed at [facility], [address]  [place], [place] [address]   Culture, blood  (routine x 2)     Status: None (Preliminary result)   Collection Time: 06/05/21  4:13 PM   Specimen: BLOOD  Result Value Ref Range Status   Specimen Description   Final    BLOOD BLOOD RIGHT HAND Performed at [facility], [address], [place], [place] [address]    Special Requests   Final    Blood Culture adequate volume BOTTLES DRAWN AEROBIC AND ANAEROBIC Performed at [facility], [address], [place], [place] [address]    Culture   Final    NO GROWTH 4 DAYS Performed at [facility], [place] [address], [place], [place] [address]    Report Status PENDING  Incomplete  Aerobic/Anaerobic Culture w Gram Stain (surgical/deep wound)     Status: None (Preliminary result)   Collection Time: 06/05/21  8:06 PM   Specimen: Wound; Abscess  Result Value Ref Range Status   Specimen Description   Final    WOUND RIGHT SCROTUM Performed at [place] [address] [place], [place] [address]    Special Requests   Final    NONE Performed at [facility], [place] [address], [place], [place] [address]    Gram Stain   Final    ABUNDANT WBC PRESENT,BOTH PMN AND MONONUCLEAR ABUNDANT GRAM POSITIVE COCCI IN PAIRS AND CHAINS FEW GRAM NEGATIVE RODS Performed at [facility], [place] [address], [place], [place] [address]    Culture   Final    ABUNDANT ENTEROCOCCUS FAECALIS MIXED ANAEROBIC FLORA PRESENT.  CALL LAB IF FURTHER IID REQUIRED.    Report Status PENDING  Incomplete   Organism ID, Bacteria ENTEROCOCCUS FAECALIS  Final      Susceptibility   Enterococcus faecalis - MIC*    AMPICILLIN <=2 SENSITIVE Sensitive     VANCOMYCIN 2 SENSITIVE Sensitive     GENTAMICIN SYNERGY SENSITIVE Sensitive     * ABUNDANT ENTEROCOCCUS FAECALIS    Please note: You were cared for by a hospitalist during your hospital stay. Once you are discharged, your primary care physician will handle any further medical issues. Please  note that NO REFILLS for any discharge medications will be authorized once you are discharged, as it is imperative that you return to your primary care physician (or establish a relationship with a primary care physician if you do not have one) for your post hospital discharge needs so that they can reassess your need for medications and monitor your lab values.    Time coordinating discharge: 40 minutes  SIGNED:   Shelly Coss, MD  [facility] 06/09/2021, 11:08 AM Pager [phone]  If 7PM-7AM, please contact night-coverage [email or web address] Password [email or web address]

## 2021-06-09 NOTE — Progress Notes (Signed)
Brief ID note:  Patient scrotal abscess cultures with abundant ampicillin sensitive Enterococcus faecalis and mixed anaerobic flora.  Currently on Unasyn.  Would be okay with transition to Augmentin to complete duration of antibiotic therapy to help facilitate discharge.  Recommend 10 days of therapy from date of his incision and drainage through 06/15/2021.  Please call as needed.    Vedia Coffer for Infectious Disease [facility] 06/09/2021, 9:53 AM

## 2021-06-09 NOTE — Progress Notes (Signed)
AVS given to patient and explained at the bedside. Medications and follow up appointments have been explained with pt verbalizing understanding.  

## 2021-06-10 ENCOUNTER — Other Ambulatory Visit (HOSPITAL_BASED_OUTPATIENT_CLINIC_OR_DEPARTMENT_OTHER): Payer: Self-pay

## 2021-06-10 LAB — CULTURE, BLOOD (ROUTINE X 2)
Culture: NO GROWTH
Culture: NO GROWTH
Special Requests: ADEQUATE
Special Requests: ADEQUATE

## 2021-07-01 DIAGNOSIS — N492 Inflammatory disorders of scrotum: Secondary | ICD-10-CM | POA: Diagnosis not present

## 2021-07-16 ENCOUNTER — Encounter: Payer: Self-pay | Admitting: Physician Assistant

## 2021-07-16 ENCOUNTER — Ambulatory Visit: Payer: BC Managed Care – PPO | Admitting: Physician Assistant

## 2021-07-16 ENCOUNTER — Other Ambulatory Visit: Payer: Self-pay

## 2021-07-16 VITALS — BP 145/77 | HR 74 | Temp 98.0°F | Ht 74.4 in | Wt 355.8 lb

## 2021-07-16 DIAGNOSIS — I10 Essential (primary) hypertension: Secondary | ICD-10-CM

## 2021-07-16 DIAGNOSIS — E785 Hyperlipidemia, unspecified: Secondary | ICD-10-CM

## 2021-07-16 DIAGNOSIS — E119 Type 2 diabetes mellitus without complications: Secondary | ICD-10-CM

## 2021-07-16 DIAGNOSIS — Z7689 Persons encountering health services in other specified circumstances: Secondary | ICD-10-CM

## 2021-07-16 DIAGNOSIS — F39 Unspecified mood [affective] disorder: Secondary | ICD-10-CM

## 2021-07-16 DIAGNOSIS — Z720 Tobacco use: Secondary | ICD-10-CM

## 2021-07-16 DIAGNOSIS — Z1211 Encounter for screening for malignant neoplasm of colon: Secondary | ICD-10-CM | POA: Diagnosis not present

## 2021-07-16 DIAGNOSIS — E1169 Type 2 diabetes mellitus with other specified complication: Secondary | ICD-10-CM

## 2021-07-16 DIAGNOSIS — G4733 Obstructive sleep apnea (adult) (pediatric): Secondary | ICD-10-CM

## 2021-07-16 DIAGNOSIS — Z9989 Dependence on other enabling machines and devices: Secondary | ICD-10-CM

## 2021-07-16 MED ORDER — BLOOD PRESSURE KIT: 1.0000 | PACK | Freq: Two times a day (BID) | 0 refills | Status: AC

## 2021-07-16 MED ORDER — NICOTINE 21 MG/24HR TD PT24: 21.0000 mg | MEDICATED_PATCH | Freq: Every day | TRANSDERMAL | 0 refills | Status: DC

## 2021-07-16 MED ORDER — OZEMPIC (0.25 OR 0.5 MG/DOSE) 2 MG/1.5ML ~~LOC~~ SOPN: 0.2500 mg | PEN_INJECTOR | SUBCUTANEOUS | 0 refills | Status: DC

## 2021-07-16 MED ORDER — BUPROPION HCL ER (SR) 100 MG PO TB12: 100.0000 mg | ORAL_TABLET | Freq: Two times a day (BID) | ORAL | 0 refills | Status: DC

## 2021-07-16 NOTE — Assessment & Plan Note (Signed)
-  BP mildly elevated in office likely secondary to not taking antihypertensive. Will continue current medication regimen. Recommend to start monitoring blood pressure at home daily at least 2 hours after taking medication. Will reassess blood pressure at follow up visit. If remains elevated, recommend treatment adjustments such as starting an ACEi or ARB which also provides renal protection with T2DM.

## 2021-07-16 NOTE — Assessment & Plan Note (Signed)
-  A1c 06/05/2021 10.4, recommend to continue with Metformin and will send rx for Ozempic. Encourage to start weight loss efforts with dietary changes including reduction of carbohydrates and glucose intake. -Will continue to monitor.

## 2021-07-16 NOTE — Assessment & Plan Note (Addendum)
-  Reviewed lipid panel 06/06/2021, LDL 115. Discussed low fat diet. Patient prefers to work on weight loss, dietary and lifestyle changes before considering medication therapy. Will repeat lipid panel with CPE and if LDL remains above goal then recommend starting statin therapy. Discussed with patient LDL goal of <70.

## 2021-07-16 NOTE — Patient Instructions (Signed)
Diabetes Mellitus and Nutrition, Adult When you have diabetes, or diabetes mellitus, it is very important to have healthy eating habits because your blood sugar (glucose) levels are greatly affected by what you eat and drink. Eating healthy foods in the right amounts, at about the same times every day, can help you:  Control your blood glucose.  Lower your risk of heart disease.  Improve your blood pressure.  Reach or maintain a healthy weight. What can affect my meal plan? Every person with diabetes is different, and each person has different needs for a meal plan. Your health care provider may recommend that you work with a dietitian to make a meal plan that is best for you. Your meal plan may vary depending on factors such as:  The calories you need.  The medicines you take.  Your weight.  Your blood glucose, blood pressure, and cholesterol levels.  Your activity level.  Other health conditions you have, such as heart or kidney disease. How do carbohydrates affect me? Carbohydrates, also called carbs, affect your blood glucose level more than any other type of food. Eating carbs naturally raises the amount of glucose in your blood. Carb counting is a method for keeping track of how many carbs you eat. Counting carbs is important to keep your blood glucose at a healthy level, especially if you use insulin or take certain oral diabetes medicines. It is important to know how many carbs you can safely have in each meal. This is different for every person. Your dietitian can help you calculate how many carbs you should have at each meal and for each snack. How does alcohol affect me? Alcohol can cause a sudden decrease in blood glucose (hypoglycemia), especially if you use insulin or take certain oral diabetes medicines. Hypoglycemia can be a life-threatening condition. Symptoms of hypoglycemia, such as sleepiness, dizziness, and confusion, are similar to symptoms of having too much  alcohol.  Do not drink alcohol if: ? Your health care provider tells you not to drink. ? You are pregnant, may be pregnant, or are planning to become pregnant.  If you drink alcohol: ? Do not drink on an empty stomach. ? Limit how much you use to:  0-1 drink a day for women.  0-2 drinks a day for men. ? Be aware of how much alcohol is in your drink. In the [place] one drink equals one 12 oz bottle of beer (355 mL), one 5 oz glass of wine (148 mL), or one 1 oz glass of hard liquor (44 mL). ? Keep yourself hydrated with water, diet soda, or unsweetened iced tea.  Keep in mind that regular soda, juice, and other mixers may contain a lot of sugar and must be counted as carbs. What are tips for following this plan? Reading food labels  Start by checking the serving size on the "Nutrition Facts" label of packaged foods and drinks. The amount of calories, carbs, fats, and other nutrients listed on the label is based on one serving of the item. Many items contain more than one serving per package.  Check the total grams (g) of carbs in one serving. You can calculate the number of servings of carbs in one serving by dividing the total carbs by 15. For example, if a food has 30 g of total carbs per serving, it would be equal to 2 servings of carbs.  Check the number of grams (g) of saturated fats and trans fats in one serving. Choose foods that have   a low amount or none of these fats.  Check the number of milligrams (mg) of salt (sodium) in one serving. Most people should limit total sodium intake to less than 2,300 mg per day.  Always check the nutrition information of foods labeled as "low-fat" or "nonfat." These foods may be higher in added sugar or refined carbs and should be avoided.  Talk to your dietitian to identify your daily goals for nutrients listed on the label. Shopping  Avoid buying canned, pre-made, or processed foods. These foods tend to be high in fat, sodium, and added  sugar.  Shop around the outside edge of the grocery store. This is where you will most often find fresh fruits and vegetables, bulk grains, fresh meats, and fresh dairy. Cooking  Use low-heat cooking methods, such as baking, instead of high-heat cooking methods like deep frying.  Cook using healthy oils, such as olive, canola, or sunflower oil.  Avoid cooking with butter, cream, or high-fat meats. Meal planning  Eat meals and snacks regularly, preferably at the same times every day. Avoid going long periods of time without eating.  Eat foods that are high in fiber, such as fresh fruits, vegetables, beans, and whole grains. Talk with your dietitian about how many servings of carbs you can eat at each meal.  Eat 4-6 oz (112-168 g) of lean protein each day, such as lean meat, chicken, fish, eggs, or tofu. One ounce (oz) of lean protein is equal to: ? 1 oz (28 g) of meat, chicken, or fish. ? 1 egg. ?  cup (62 g) of tofu.  Eat some foods each day that contain healthy fats, such as avocado, nuts, seeds, and fish.   What foods should I eat? Fruits Berries. Apples. Oranges. Peaches. Apricots. Plums. Grapes. Mango. Papaya. Pomegranate. Kiwi. Cherries. Vegetables Lettuce. Spinach. Leafy greens, including kale, chard, collard greens, and mustard greens. Beets. Cauliflower. Cabbage. Broccoli. Carrots. Green beans. Tomatoes. Peppers. Onions. Cucumbers. Brussels sprouts. Grains Whole grains, such as whole-wheat or whole-grain bread, crackers, tortillas, cereal, and pasta. Unsweetened oatmeal. Quinoa. Brown or wild rice. Meats and other proteins Seafood. Poultry without skin. Lean cuts of poultry and beef. Tofu. Nuts. Seeds. Dairy Low-fat or fat-free dairy products such as milk, yogurt, and cheese. The items listed above may not be a complete list of foods and beverages you can eat. Contact a dietitian for more information. What foods should I avoid? Fruits Fruits canned with  syrup. Vegetables Canned vegetables. Frozen vegetables with butter or cream sauce. Grains Refined white flour and flour products such as bread, pasta, snack foods, and cereals. Avoid all processed foods. Meats and other proteins Fatty cuts of meat. Poultry with skin. Breaded or fried meats. Processed meat. Avoid saturated fats. Dairy Full-fat yogurt, cheese, or milk. Beverages Sweetened drinks, such as soda or iced tea. The items listed above may not be a complete list of foods and beverages you should avoid. Contact a dietitian for more information. Questions to ask a health care provider  Do I need to meet with a diabetes educator?  Do I need to meet with a dietitian?  What number can I call if I have questions?  When are the best times to check my blood glucose? Where to find more information:  American Diabetes Association: diabetes.org  Academy of Nutrition and Dietetics: www.eatright.org  National Institute of Diabetes and Digestive and Kidney Diseases: www.niddk.nih.gov  Association of Diabetes Care and Education Specialists: www.diabeteseducator.org Summary  It is important to have healthy eating   habits because your blood sugar (glucose) levels are greatly affected by what you eat and drink.  A healthy meal plan will help you control your blood glucose and maintain a healthy lifestyle.  Your health care provider may recommend that you work with a dietitian to make a meal plan that is best for you.  Keep in mind that carbohydrates (carbs) and alcohol have immediate effects on your blood glucose levels. It is important to count carbs and to use alcohol carefully. This information is not intended to replace advice given to you by your health care provider. Make sure you discuss any questions you have with your health care provider. Document Revised: 08/16/2019 Document Reviewed: 08/16/2019 Elsevier Patient Education  2021 Elsevier Inc.  

## 2021-07-16 NOTE — Assessment & Plan Note (Addendum)
-  PHQ-9 score of 13, GAD-7 score of 13. Denies SI/HI.  -Discussed with patient management options and agreeable to trial medication therapy with Wellbutrin which could potentially also help with smokeless tobacco cessation. Discussed potential side effects and advised to let me know if unable to tolerate medication. -Will continue to monitor.

## 2021-07-16 NOTE — Progress Notes (Signed)
New Patient Office Visit  Subjective:  Patient ID: Robert Lewis, male    DOB: [birth year]  Age: 55 y.o. MRN: [id number]  CC:  Chief Complaint  Patient presents with   New Patient (Initial Visit)    HPI Robert Lewis presents to establish care. Patient has a past medical hx of type 2 diabetes mellitus, hypertension, hyperlipidemia, and OSA. States reports was given nicotine patch at hist last hospitalization and it has helped with reducing smokeless tobacco use and requesting refill. 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 Patient was hospitalized 06/05/2021 for Fournier's gangrene and started on medications for uncontrolled diabetes with Metformin and Ozempic, however, did not get rx for Ozempic. Reports compliance with CPAP machine for OSA. Patient was started on amlodipine 10 mg for hypertension and patient reports sometimes forgets to take his medication, has not taken medication for today yet. Requesting referral for screening colonoscopy, reports remote hx of hemorrhoids and noticing bright red blood with stools but none recently. No fever, chills, dark tarry stools or unintentional weight loss. Reports is planning to change his diet for weight loss, thinking about plant based diet.     Past Medical History:  Diagnosis Date   Agatston coronary artery calcium score between 200 and 399 05/2019   Controlled diabetes mellitus type II without complication ([place])    Not on insulin   Hyperlipidemia associated with type 2 diabetes mellitus ([place])    Hypertension    Morbid obesity with BMI of 45.0-49.9, adult ([place])    OSA on CPAP     Past Surgical History:  Procedure Laterality Date   APPENDECTOMY     FEMUR FRACTURE SURGERY Left    ORIF with pin in place   [place] Right 06/05/2021   Procedure: [place];  Surgeon: [name], MD;  Location: [facility];   Service: Urology;  Laterality: Right;   TRANSTHORACIC ECHOCARDIOGRAM  05/2019   EF 60-65%.  Normal wall motion.  Mild LVH.  Normal RV size and function.  Normal atria.  Mild MAC.  Mild aortic sclerosis with no stenosis.--Essentially normal.    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  Tobacco Use   Smoking status: Never   Smokeless tobacco: Current    Types: Chew  Vaping Use   Vaping Use: Never used  Substance and Sexual Activity   Alcohol use: Never    Comment: Rare   Drug use: Never   Sexual activity: Not Currently  Other Topics Concern   Not on file  Social History Narrative   He self acknowledges that he is out of shape.  Does not exercise.  He does have to walk quite a long ways from the parking lot to where his truck  is parked for work, and sometimes loading off loading the truck can be difficult.   Social Determinants of Health   Financial Resource Strain: Not on file  Food Insecurity: Not on file  Transportation Needs: Not on file  Physical Activity: Not on file  Stress: Not on file  Social Connections: Not on file  Intimate Partner Violence: Not on file   Depression screen [facility] 2/9 07/16/2021  Decreased Interest 3  Down, Depressed, Hopeless 1  PHQ - 2 Score 4  Altered sleeping 1  Tired, decreased energy 3  Change in appetite 3  Feeling bad or failure about yourself  1  Trouble concentrating 1  Moving slowly or fidgety/restless 0   Suicidal thoughts 0  PHQ-9 Score 13   GAD 7 : Generalized Anxiety Score 07/16/2021  Nervous, Anxious, on Edge 1  Control/stop worrying 3  Worry too much - different things 3  Trouble relaxing 1  Restless 1  Easily annoyed or irritable 3  Afraid - awful might happen 1  Total GAD 7 Score 13       ROS Review of Systems Review of Systems:  A fourteen system review of systems was performed and found to be positive as per HPI.  Objective:   Today's Vitals: BP (!) 145/77   Pulse 74   Temp 98 F (36.7 C)   Ht 6' 2.4" (1.89 m)   Wt (!) 355 lb 12.8 oz (161.4 kg)   SpO2 98%   BMI 45.19 kg/m   Physical Exam General:  Well Developed, well nourished, appropriate for stated age.  Neuro:  Alert and oriented,  extra-ocular muscles intact  HEENT:  Normocephalic, atraumatic, neck supple Skin:  no gross rash, warm, pink. Cardiac:  RRR, S1 S2 Respiratory:  CTA B/L, Not using accessory muscles, speaking in full sentences- unlabored. Vascular:  Ext warm, no cyanosis apprec.; cap RF less 2 sec. Psych:  No HI/SI, judgement and insight good, Euthymic mood. Full Affect.  Assessment & Plan:   Problem List Items Addressed This Visit       Cardiovascular and Mediastinum   Essential hypertension (Chronic)    -BP mildly elevated in office likely secondary to not taking antihypertensive. Will continue current medication regimen. Recommend to start monitoring blood pressure at home daily at least 2 hours after taking medication. Will reassess blood pressure at follow up visit. If remains elevated, recommend treatment adjustments such as starting an ACEi or ARB which also provides renal protection with T2DM.         Respiratory   OSA on CPAP     Endocrine   Type 2 diabetes mellitus without complication, without long-term current use of insulin (HCC) (Chronic)    -A1c 06/05/2021 10.4, recommend to continue with Metformin and will send rx for Ozempic. Encourage to start weight loss efforts  with dietary changes including reduction of carbohydrates and glucose intake. -Will continue to monitor.      Relevant Medications   Semaglutide,0.25 or 0.5MG/DOS, (OZEMPIC, 0.25 OR 0.5 MG/DOSE,) 2 MG/1.5ML SOPN   Blood Pressure KIT   Hyperlipidemia associated with type 2 diabetes mellitus ([place])    -Reviewed lipid panel 06/06/2021, LDL 115. Discussed low fat diet. Patient prefers to work on weight loss, dietary and lifestyle changes before considering medication therapy. Will repeat lipid panel with CPE and if LDL remains above goal then recommend starting statin therapy. Discussed with patient LDL goal of <70.       Relevant Medications   Semaglutide,0.25 or 0.5MG/DOS, ([place],  0.25 OR 0.5 MG/DOSE,) 2 MG/1.5ML SOPN     Other   Mood disorder (HCC)    -PHQ-9 score of 13, GAD-7 score of 13. Denies SI/HI.  -Discussed with patient management options and agreeable to trial medication therapy with Wellbutrin which could potentially also help with smokeless tobacco cessation. Discussed potential side effects and advised to let me know if unable to tolerate medication. -Will continue to monitor.      Other Visit Diagnoses     Encounter to establish care    -  Primary   Screening for colon cancer       Relevant Orders   Ambulatory referral to Gastroenterology   Tobacco use       Relevant Medications   buPROPion ER (WELLBUTRIN SR) 100 MG 12 hr tablet   nicotine (NICODERM CQ) 21 mg/24hr patch      Encounter to establish care: -Reviewed hospital notes, labs and imaging. -Will review previous PCP records once obtained. -Will place referral for screening colonoscopy. CBC 06/08/2021: hemoglobin 13.8, hematocrit 41.9 normal. -Advised to schedule CPE and FBW in 4 weeks. -Declined influenza vaccine. -Encourage to continue with tobacco reduction and eventually quit. Provided rx for NRT.   OSA on CPAP: -Continue CPAP.  Outpatient Encounter Medications as of 07/16/2021  Medication Sig    buPROPion ER (WELLBUTRIN SR) 100 MG 12 hr tablet Take 1 tablet (100 mg total) by mouth 2 (two) times daily.   nicotine (NICODERM CQ) 21 mg/24hr patch Place 1 patch (21 mg total) onto the skin daily.   amLODipine (NORVASC) 10 MG tablet Take 1 tablet (10 mg total) by mouth daily.   blood glucose meter kit and supplies KIT Dispense based on patient and insurance preference. Use up to four times daily as directed.   Blood Pressure KIT 1 each by Does not apply route 2 (two) times daily.   metFORMIN (GLUCOPHAGE) 500 MG tablet Take 1 tablet (500 mg total) by mouth 2 (two) times daily with a meal.   Semaglutide,0.25 or 0.5MG/DOS, (OZEMPIC, 0.25 OR 0.5 MG/DOSE,) 2 MG/1.5ML SOPN Inject 0.25 mg into the skin once a week. Take 0.25 mg a week for 4 weeks then start taking 0.5 mg weekly   [DISCONTINUED] Blood Pressure KIT 1 each by Does not apply route 2 (two) times daily.   [DISCONTINUED] Semaglutide,0.25 or 0.5MG/DOS, (OZEMPIC, 0.25 OR 0.5 MG/DOSE,) 2 MG/1.5ML SOPN Inject 0.25 mg into the skin once a week. Take 0.25 mg a week for 4 weeks then start taking 0.5 mg weekly   No facility-administered encounter medications on file as of 07/16/2021.    Follow-up: Return in about 4 weeks (around 08/13/2021) for CPE and FBW.   Lorrene Reid, PA-C

## 2021-08-11 ENCOUNTER — Other Ambulatory Visit: Payer: Self-pay | Admitting: Physician Assistant

## 2021-08-11 DIAGNOSIS — Z720 Tobacco use: Secondary | ICD-10-CM

## 2021-08-26 ENCOUNTER — Ambulatory Visit (INDEPENDENT_AMBULATORY_CARE_PROVIDER_SITE_OTHER): Payer: BC Managed Care – PPO | Admitting: Physician Assistant

## 2021-08-26 ENCOUNTER — Other Ambulatory Visit: Payer: Self-pay

## 2021-08-26 ENCOUNTER — Encounter: Payer: Self-pay | Admitting: Physician Assistant

## 2021-08-26 VITALS — BP 160/82 | HR 80 | Temp 97.7°F | Ht 74.0 in | Wt 352.0 lb

## 2021-08-26 DIAGNOSIS — Z0000 Encounter for general adult medical examination without abnormal findings: Secondary | ICD-10-CM | POA: Diagnosis not present

## 2021-08-26 DIAGNOSIS — I10 Essential (primary) hypertension: Secondary | ICD-10-CM | POA: Diagnosis not present

## 2021-08-26 DIAGNOSIS — E119 Type 2 diabetes mellitus without complications: Secondary | ICD-10-CM | POA: Diagnosis not present

## 2021-08-26 DIAGNOSIS — Z8042 Family history of malignant neoplasm of prostate: Secondary | ICD-10-CM | POA: Diagnosis not present

## 2021-08-26 LAB — POCT GLYCOSYLATED HEMOGLOBIN (HGB A1C): Hemoglobin A1C: 7.4 % — AB (ref 4.0–5.6)

## 2021-08-26 LAB — POCT UA - MICROALBUMIN
Albumin/Creatinine Ratio, Urine, POC: <30
Creatinine, POC: 300 mg/dL
Microalbumin Ur, POC: 30 mg/L

## 2021-08-26 MED ORDER — OZEMPIC (0.25 OR 0.5 MG/DOSE) 2 MG/1.5ML ~~LOC~~ SOPN: 0.5000 mg | PEN_INJECTOR | SUBCUTANEOUS | 1 refills | Status: DC

## 2021-08-26 MED ORDER — METFORMIN HCL ER 500 MG PO TB24: 500.0000 mg | ORAL_TABLET | Freq: Two times a day (BID) | ORAL | 1 refills | Status: DC

## 2021-08-26 NOTE — Progress Notes (Addendum)
Male physical   Impression and Recommendations:    1. Healthcare maintenance   2. Type 2 diabetes mellitus without complication, without long-term current use of insulin ([place])   3. Family history of prostate cancer in father   64. Essential hypertension      1) Anticipatory Guidance: Skin CA prevention- recommend to use sunscreen when outside along with skin surveillance; eating a balanced and modest diet; physical activity at least 25 minutes per day or minimum of 150 min/ week moderate to intense activity.  2) Immunizations / Screenings / Labs:   All immunizations are up-to-date per recommendations or will be updated today if pt allows.    - Patient understands with dental and vision screens they will schedule independently.  - Will obtain CBC, CMP, HgA1c, Lipid panel, TSH and PSA (patient has family hx of prostate cancer) when fasting. - Declined immunizations. Will contact gastroenterology for screening colonoscopy, has already been contacted by them. - Will request diabetic eye exam.  3) Weight: Recommend to continue to improve diet habits to improve overall feelings of well being and objective health data. Improve nutrient density of diet through increasing intake of fruits and vegetables and decreasing saturated fats, white flour products and refined sugars.   4) Healthcare Maintenance: -Advised can schedule OV for ear irrigation and/or use OTC debrox for excessive cerumen. -Schedule lab visit for FBW (not A1c). A1c has improved from 10.4 to 7.4, will send rx for Metformin 500 mg XR due to GI side effects with current metformin. UA microalbumin normal. -Continue current medication regimen. -BP elevate in office, patient has not taken antihypertensive medication and reports also missed dose yesterday. Discussed medication adherence.  -Follow up in 4 months for reg OV: DM, HTN, HLD   Orders Placed This Encounter  Procedures   POCT glycosylated hemoglobin (Hb A1C)   POCT  UA - Microalbumin    Meds ordered this encounter  Medications   metFORMIN (GLUCOPHAGE XR) 500 MG 24 hr tablet    Sig: Take 1 tablet (500 mg total) by mouth 2 (two) times daily with a meal.    Dispense:  180 tablet    Refill:  1    Order Specific Question:   Supervising Provider    Answer:   Beatrice Lecher D [[id number]]   Semaglutide,0.25 or 0.5MG/DOS, (OZEMPIC, 0.25 OR 0.5 MG/DOSE,) 2 MG/1.5ML SOPN    Sig: Inject 0.5 mg into the skin once a week.    Dispense:  3 mL    Refill:  1    Order Specific Question:   Supervising Provider    Answer:   Beatrice Lecher D [[id number]]     Return in about 4 months (around 12/25/2021) for DM, HTN; lab visit for FBW (lipid panel, cmp, cbc w/d, PSA) .     Gross side effects, risk and benefits, and alternatives of medications discussed with patient.  Patient is aware that all medications have potential side effects and we are unable to predict every side effect or drug-drug interaction that may occur.  Expresses verbal understanding and consents to current therapy plan and treatment regimen.  Please see AVS handed out to patient at the end of our visit for further patient instructions/ counseling done pertaining to today's office visit.       Subjective:        CC: CPE   HPI: Robert Lewis is a 55 y.o. male who [facility] today for a  yearly health maintenance exam.     Health Maintenance Summary  - Reviewed and updated, unless pt declines services.  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   Health Maintenance  Topic Date Due   COVID-19 Vaccine (1) Never done   Pneumococcal Vaccine 19-64 Years old (1 - PCV) Never done   FOOT EXAM  Never done   OPHTHALMOLOGY EXAM  Never done   Hepatitis C Screening  Never done   TETANUS/TDAP  Never done   COLONOSCOPY (Pts 45-49yrs Insurance coverage will need to be confirmed)  Never done   Zoster Vaccines- Shingrix (1 of 2) Never done   INFLUENZA VACCINE  07/16/2022 (Originally 04/22/2021)   HEMOGLOBIN A1C  02/24/2022   URINE MICROALBUMIN  08/26/2022   HIV Screening  Completed   HPV VACCINES  Aged Out       Wt Readings from Last 3 Encounters:  08/26/21 (!) 352 lb (159.7 kg)  07/16/21 (!) 355 lb 12.8 oz (161.4 kg)  06/05/21 (!) 360 lb (163.3 kg)   BP Readings from Last 3 Encounters:  08/26/21 (!) 160/82  07/16/21 (!) 145/77  06/09/21 (!) 169/82   Pulse Readings from Last 3 Encounters:  08/26/21 80  07/16/21 74  06/09/21 65    Patient Active Problem List   Diagnosis Date Noted   Mood disorder (HCC) 07/16/2021   Hyperlipidemia associated with type 2 diabetes mellitus (HCC) 07/16/2021   Scrotal abscess 06/05/2021   OSA on CPAP 06/05/2021   Metabolic syndrome 06/06/2019   Essential hypertension 06/06/2019   Near syncope 06/06/2019   Type 2 diabetes mellitus without complication, without long-term current use of insulin (HCC) 06/06/2019   Morbid obesity (HCC) 06/06/2019   Agatston coronary artery calcium score between 200 and 399 05/2019    Past Medical History:  Diagnosis Date   Agatston coronary artery calcium score between 200 and 399 05/2019   Controlled diabetes mellitus type II without complication (HCC)    Not on insulin   Hyperlipidemia associated with type 2 diabetes mellitus (HCC)    Hypertension    Morbid obesity with BMI of 45.0-49.9, adult (HCC)    OSA on CPAP     Past Surgical History:   Procedure Laterality Date   APPENDECTOMY     FEMUR FRACTURE SURGERY Left    ORIF with pin in place   IRRIGATION AND DEBRIDEMENT ABSCESS Right 06/05/2021   Procedure: IRRIGATION AND DEBRIDEMENT ABSCESS;  Surgeon: Borden, Lester, MD;  Location: [facility];  Service: Urology;  Laterality: Right;   TRANSTHORACIC ECHOCARDIOGRAM  05/2019   EF 60-65%.  Normal wall motion.  Mild LVH.  Normal RV size and function.  Normal atria.  Mild MAC.  Mild aortic sclerosis with no stenosis.--Essentially normal.    Family History  Problem Relation Age of Onset   Prostate cancer Father    Heart attack Father 49   Pulmonary embolism Father 60         Cause of death   Heart attack Paternal Grandfather        Presumably in his 40s    Social History   Substance and Sexual Activity  Drug Use Never  ,  Social History   Substance and Sexual Activity  Alcohol Use Never   Comment: Rare  ,  Social History   Tobacco Use  Smoking Status Never  Smokeless Tobacco Current   Types: Chew  ,  Social History   Substance and Sexual Activity  Sexual Activity Not Currently    Patient's Medications  New Prescriptions   METFORMIN (GLUCOPHAGE XR) 500 MG 24 HR TABLET    Take 1 tablet (500 mg total) by mouth 2 (two) times daily with a meal.  Previous Medications   ACCU-CHEK GUIDE TEST STRIP    DISPENSE BASED ON PATIENT AND INSURANCE PREFERENCE. USE UP TO FOUR TIMES DAILY AS DIRECTED.   ACCU-CHEK SOFTCLIX LANCETS LANCETS    See admin instructions.   AMLODIPINE (NORVASC) 10 MG TABLET    Take 1 tablet (10 mg total) by mouth daily.   BLOOD GLUCOSE METER KIT AND SUPPLIES KIT    Dispense based on patient and insurance preference. Use up to four times daily as directed.   BLOOD PRESSURE KIT    1 each by Does not apply route 2 (two) times daily.   BUPROPION ER (WELLBUTRIN SR) 100 MG 12 HR TABLET    Take 1 tablet (100 mg total) by mouth 2 (two) times daily.   NICOTINE (NICODERM CQ - DOSED IN MG/24 HOURS) 21 MG/24HR PATCH     PLACE 1 PATCH ONTO THE SKIN DAILY.  Modified Medications   Modified Medication Previous Medication   SEMAGLUTIDE,0.25 OR 0.5MG/DOS, (OZEMPIC, 0.25 OR 0.5 MG/DOSE,) 2 MG/1.5ML SOPN Semaglutide,0.25 or 0.5MG/DOS, (OZEMPIC, 0.25 OR 0.5 MG/DOSE,) 2 MG/1.5ML SOPN      Inject 0.5 mg into the skin once a week.    Inject 0.25 mg into the skin once a week. Take 0.25 mg a week for 4 weeks then start taking 0.5 mg weekly  Discontinued Medications   METFORMIN (GLUCOPHAGE) 500 MG TABLET    Take 1 tablet (500 mg total) by mouth 2 (two) times daily with a meal.    Patient has no known allergies.  Review of Systems: General:   Denies fever, chills, unexplained weight loss.  Optho/Auditory:   Denies visual changes, blurred vision/LOV Respiratory:   Denies SOB, DOE more than baseline levels.   Cardiovascular:   Denies chest pain, palpitations, new onset peripheral edema  Gastrointestinal:   Denies nausea, vomiting, diarrhea.  Genitourinary: Denies dysuria, freq/ urgency, flank pain  Endocrine:     Denies hot or cold intolerance, polyuria, polydipsia. Musculoskeletal:   Denies unexplained myalgias, joint swelling, unexplained arthralgias, gait problems.  Skin:  Denies rash, suspicious lesions Neurological:     Denies dizziness, unexplained weakness, numbness  Psychiatric/Behavioral:   Denies mood changes, suicidal or homicidal ideations, hallucinations    Objective:     Blood pressure (!) 160/82, pulse 80, temperature 97.7 F (36.5 C), height 6' 2" (1.88 m), weight (!) 352 lb (159.7 kg), SpO2 98 %. Body mass index is 45.19 kg/m. General Appearance:    Alert, cooperative, no distress, appears stated age  Head:    Normocephalic, without obvious abnormality, atraumatic  Eyes:    PERRL, conjunctiva/corneas clear, EOM's intact, fundi    benign, both eyes  Ears:    Normal TM's and external ear canals, both ears (some excessive cerumen of   right ear)  Nose:   Nares normal, septum midline, mucosa normal,  no drainage    or sinus tenderness  Throat:   Lips w/o lesion, mucosa moist, and tongue normal; teeth and gums normal  Neck:   Supple, symmetrical, trachea midline, no adenopathy;    thyroid:  no enlargement/tenderness/nodules; no JVD  Back:     Symmetric, no curvature, ROM normal, no CVA tenderness  Lungs:     Clear to auscultation bilaterally, respirations unlabored, no Wh/ R/ R  Chest Wall:    No tenderness or gross deformity; normal excursion   Heart:    Regular rate and rhythm, S1 and S2 normal, no murmur, rub   or gallop  Abdomen:     Soft, non-tender, bowel sounds active all four quadrants, No G/R/R, no masses, no organomegaly, diastasis recti noted  Genitalia:   Deferred.  Rectal:   Deferred.  Extremities:   Extremities normal, atraumatic, no cyanosis or gross edema  Pulses:   2+ and symmetric all extremities  Skin:   Warm, dry, Skin color, texture, turgor normal, no obvious rashes, small epidermal cyst of upper right back noted  M-Sk:   Ambulates * 4 w/o difficulty, no gross deformities, tone WNL  Neurologic:   CNII-XII grossly intact Psych:  No HI/SI, judgement and insight good, Euthymic mood. Full Affect.      

## 2021-08-26 NOTE — Patient Instructions (Signed)

## 2021-08-29 ENCOUNTER — Other Ambulatory Visit: Payer: Self-pay | Admitting: Physician Assistant

## 2021-08-29 DIAGNOSIS — Z8042 Family history of malignant neoplasm of prostate: Secondary | ICD-10-CM

## 2021-08-29 DIAGNOSIS — Z0000 Encounter for general adult medical examination without abnormal findings: Secondary | ICD-10-CM

## 2021-08-29 DIAGNOSIS — E1169 Type 2 diabetes mellitus with other specified complication: Secondary | ICD-10-CM

## 2021-08-29 DIAGNOSIS — E785 Hyperlipidemia, unspecified: Secondary | ICD-10-CM

## 2021-08-29 DIAGNOSIS — I10 Essential (primary) hypertension: Secondary | ICD-10-CM

## 2021-08-29 DIAGNOSIS — E119 Type 2 diabetes mellitus without complications: Secondary | ICD-10-CM

## 2021-09-02 ENCOUNTER — Other Ambulatory Visit: Payer: Self-pay

## 2021-09-02 ENCOUNTER — Other Ambulatory Visit (INDEPENDENT_AMBULATORY_CARE_PROVIDER_SITE_OTHER): Payer: BC Managed Care – PPO

## 2021-09-02 DIAGNOSIS — E785 Hyperlipidemia, unspecified: Secondary | ICD-10-CM | POA: Diagnosis not present

## 2021-09-02 DIAGNOSIS — Z8042 Family history of malignant neoplasm of prostate: Secondary | ICD-10-CM

## 2021-09-02 DIAGNOSIS — E1169 Type 2 diabetes mellitus with other specified complication: Secondary | ICD-10-CM | POA: Diagnosis not present

## 2021-09-02 DIAGNOSIS — E119 Type 2 diabetes mellitus without complications: Secondary | ICD-10-CM

## 2021-09-02 DIAGNOSIS — I10 Essential (primary) hypertension: Secondary | ICD-10-CM | POA: Diagnosis not present

## 2021-09-02 DIAGNOSIS — Z0000 Encounter for general adult medical examination without abnormal findings: Secondary | ICD-10-CM | POA: Diagnosis not present

## 2021-09-02 DIAGNOSIS — R051 Acute cough: Secondary | ICD-10-CM | POA: Diagnosis not present

## 2021-09-02 LAB — POC COVID19 BINAXNOW: SARS Coronavirus 2 Ag: NEGATIVE

## 2021-09-02 NOTE — Addendum Note (Signed)
Addended by: Lupita Leash on: 09/02/2021 10:59 AM   Modules accepted: Orders

## 2021-09-03 LAB — COMPREHENSIVE METABOLIC PANEL
ALT: 17 IU/L (ref 0–44)
AST: 10 IU/L (ref 0–40)
Albumin/Globulin Ratio: 1.7 (ref 1.2–2.2)
Albumin: 4.4 g/dL (ref 3.8–4.9)
Alkaline Phosphatase: 90 IU/L (ref 44–121)
BUN/Creatinine Ratio: 9 (ref 9–20)
BUN: 9 mg/dL (ref 6–24)
Bilirubin Total: 0.7 mg/dL (ref 0.0–1.2)
CO2: 21 mmol/L (ref 20–29)
Calcium: 9 mg/dL (ref 8.7–10.2)
Chloride: 100 mmol/L (ref 96–106)
Creatinine, Ser: 0.98 mg/dL (ref 0.76–1.27)
Globulin, Total: 2.6 g/dL (ref 1.5–4.5)
Glucose: 128 mg/dL — ABNORMAL HIGH (ref 70–99)
Potassium: 4.1 mmol/L (ref 3.5–5.2)
Sodium: 138 mmol/L (ref 134–144)
Total Protein: 7 g/dL (ref 6.0–8.5)
eGFR: 92 mL/min/{1.73_m2} (ref >59)

## 2021-09-03 LAB — CBC WITH DIFFERENTIAL/PLATELET
Basophils Absolute: 0.1 10*3/uL (ref 0.0–0.2)
Basos: 1 %
EOS (ABSOLUTE): 0.1 10*3/uL (ref 0.0–0.4)
Eos: 1 %
Hematocrit: 45.7 % (ref 37.5–51.0)
Hemoglobin: 15.4 g/dL (ref 13.0–17.7)
Immature Grans (Abs): 0.1 10*3/uL (ref 0.0–0.1)
Immature Granulocytes: 1 %
Lymphocytes Absolute: 2 10*3/uL (ref 0.7–3.1)
Lymphs: 12 %
MCH: 29.8 pg (ref 26.6–33.0)
MCHC: 33.7 g/dL (ref 31.5–35.7)
MCV: 89 fL (ref 79–97)
Monocytes Absolute: 1.6 10*3/uL — ABNORMAL HIGH (ref 0.1–0.9)
Monocytes: 10 %
Neutrophils Absolute: 12 10*3/uL — ABNORMAL HIGH (ref 1.4–7.0)
Neutrophils: 75 %
Platelets: 227 10*3/uL (ref 150–450)
RBC: 5.16 x10E6/uL (ref 4.14–5.80)
RDW: 12.4 % (ref 11.6–15.4)
WBC: 15.8 10*3/uL — ABNORMAL HIGH (ref 3.4–10.8)

## 2021-09-03 LAB — LIPID PANEL
Chol/HDL Ratio: 3.3 ratio (ref 0.0–5.0)
Cholesterol, Total: 154 mg/dL (ref 100–199)
HDL: 46 mg/dL (ref >39)
LDL Chol Calc (NIH): 95 mg/dL (ref 0–99)
Triglycerides: 66 mg/dL (ref 0–149)
VLDL Cholesterol Cal: 13 mg/dL (ref 5–40)

## 2021-09-03 LAB — PSA: Prostate Specific Ag, Serum: 0.5 ng/mL (ref 0.0–4.0)

## 2021-09-18 IMAGING — CT CT PELVIS W/ CM
2 of 3 series · 16 of 46 positions shown, 18 images · IV contrast (APPLIED)
Comparison: None.

CLINICAL DATA: Hernia, right scrotal/inguinal abscess

EXAM:
CT PELVIS WITH CONTRAST
TECHNIQUE: Multidetector CT imaging of the pelvis was performed using the
standard protocol following the bolus administration of intravenous
contrast.
CONTRAST:  100mL OMNIPAQUE IOHEXOL 350 MG/ML SOLN

[Series 2: pelvis w · axial · 0.93mm/px · z∈[+661,+1061]mm · 13 of 92 slices shown, 15 images]
[im 6/92  soft-tissue]
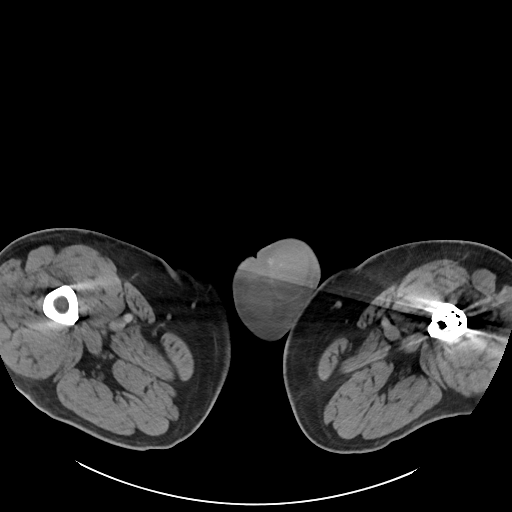
[im 6/92  bone]
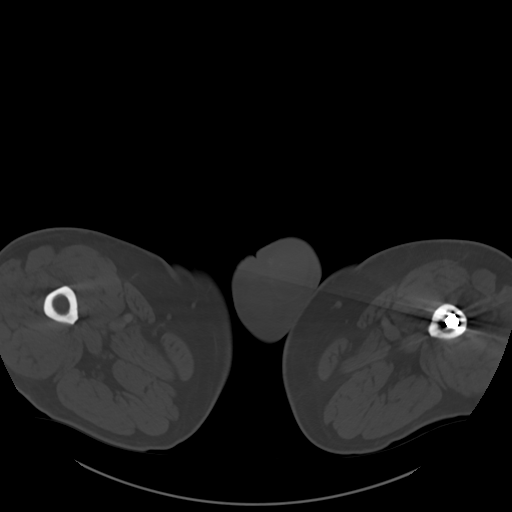
[im 12/92  soft-tissue]
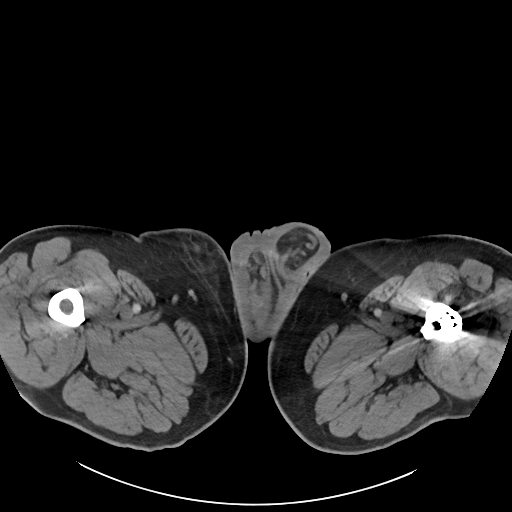
[im 18/92  soft-tissue]
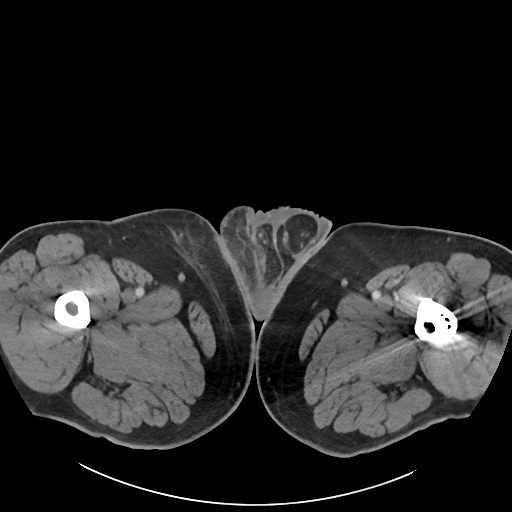
[im 27/92  soft-tissue]
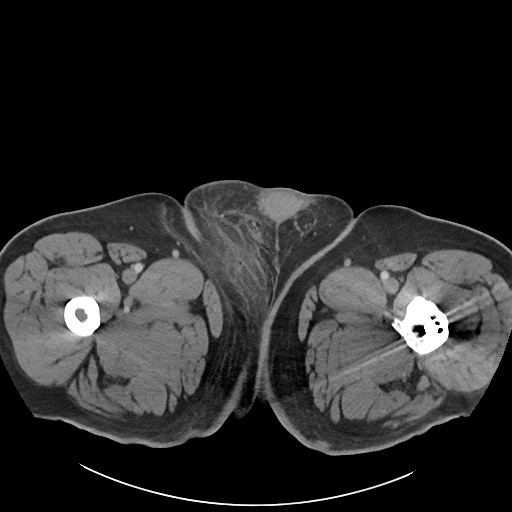
[im 33/92  soft-tissue]
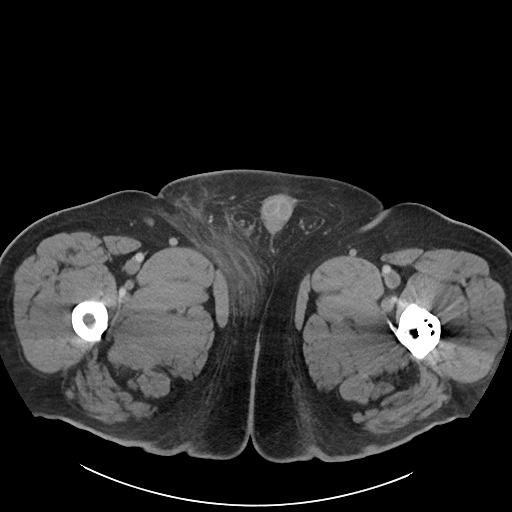
[im 39/92  soft-tissue]
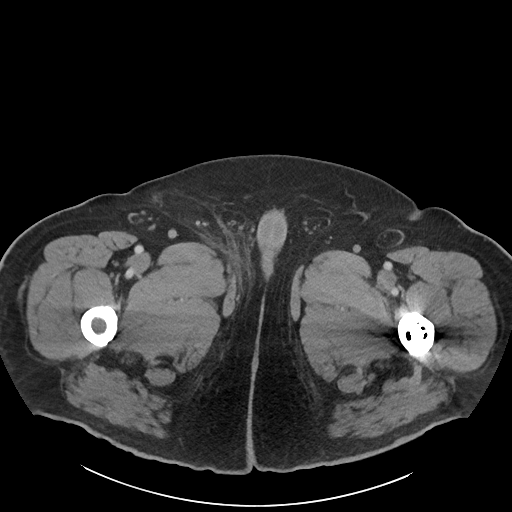
[im 47/92  soft-tissue]
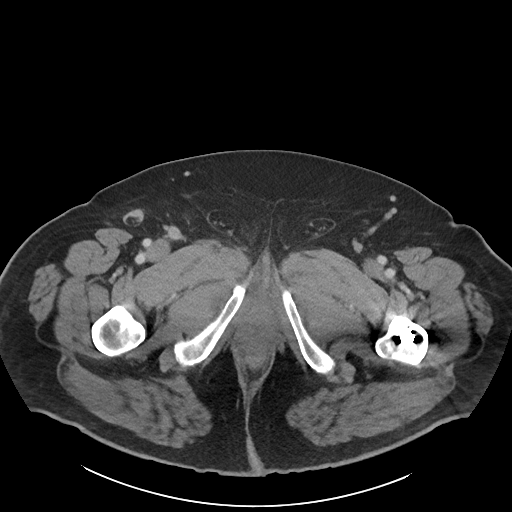
[im 53/92  soft-tissue]
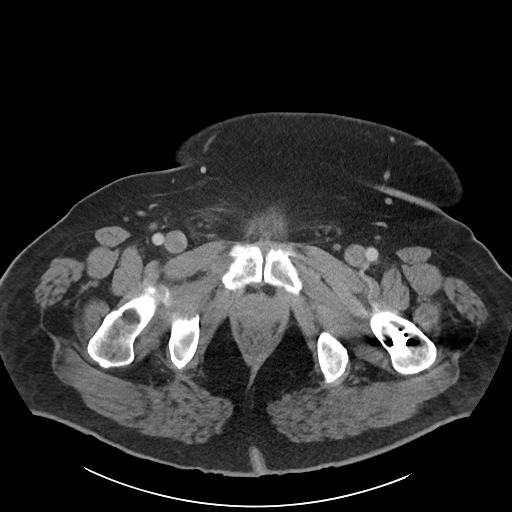
[im 59/92  soft-tissue]
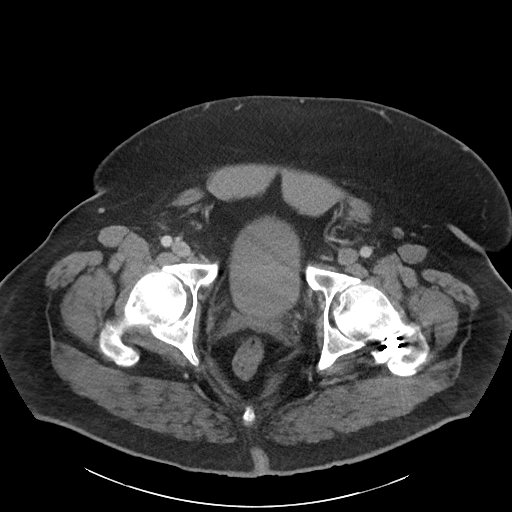
[im 59/92  bone]
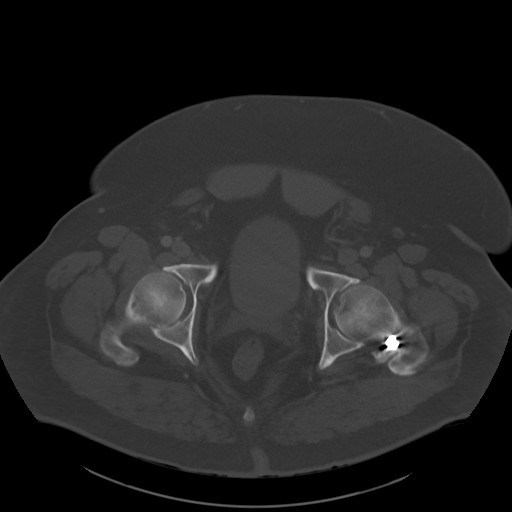
[im 65/92  soft-tissue]
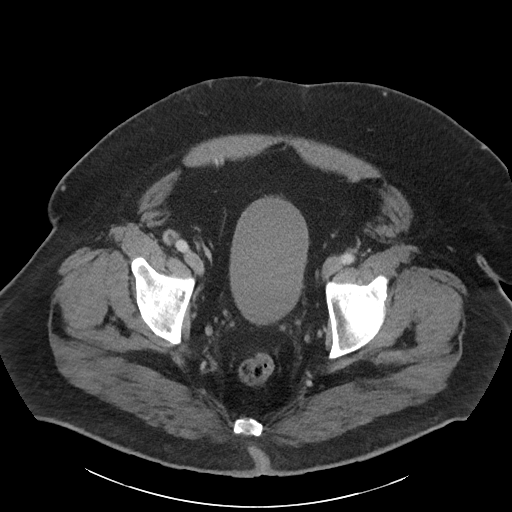
[im 74/92  soft-tissue]
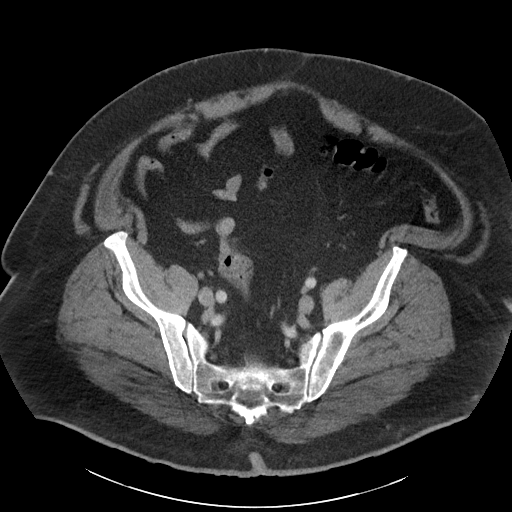
[im 80/92  soft-tissue]
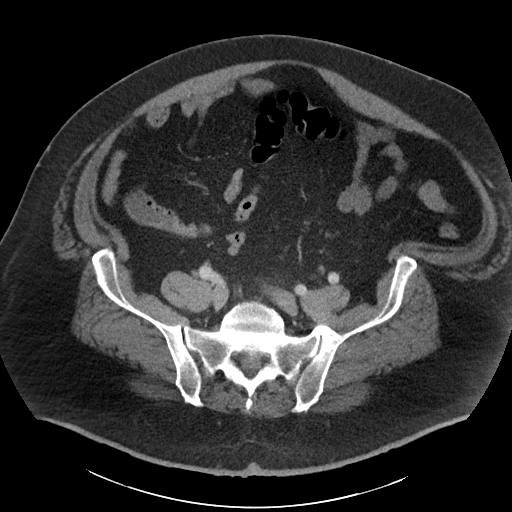
[im 86/92  soft-tissue]
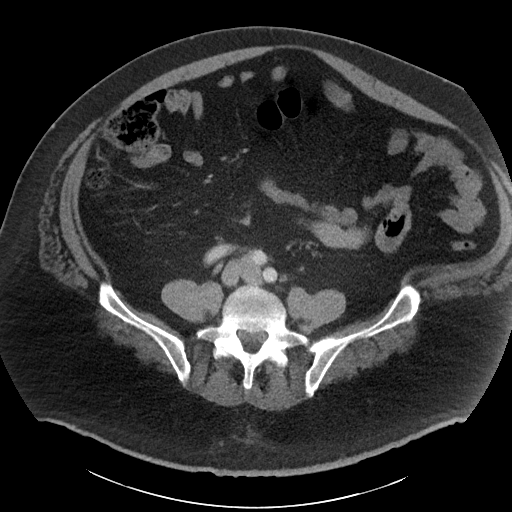

[Series 4: coronal · coronal · 0.96mm/px · 3 of 142 slices shown]
[im 48/142  soft-tissue]
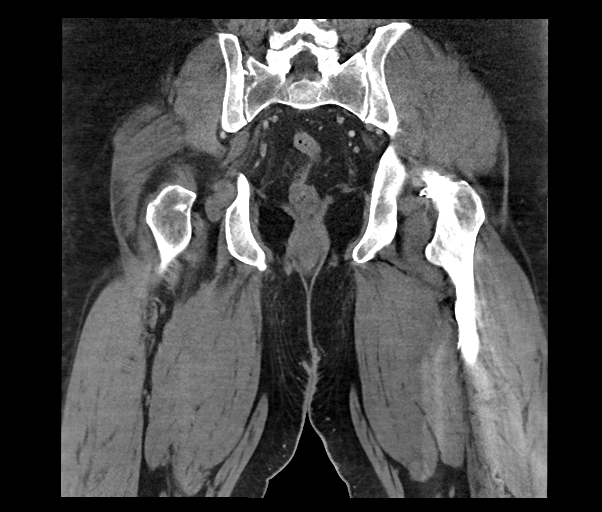
[im 63/142  soft-tissue]
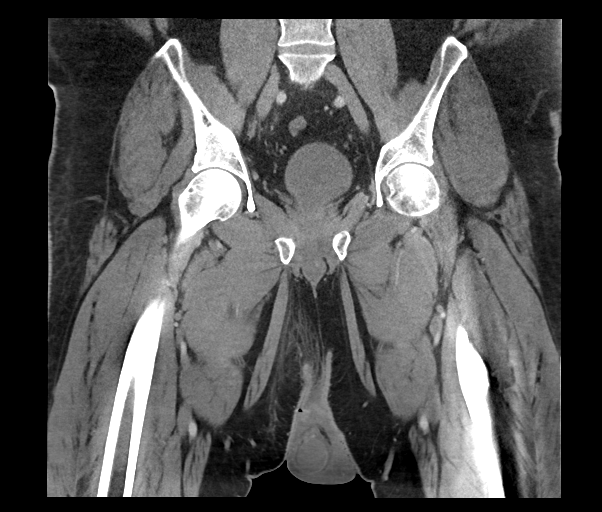
[im 79/142  soft-tissue]
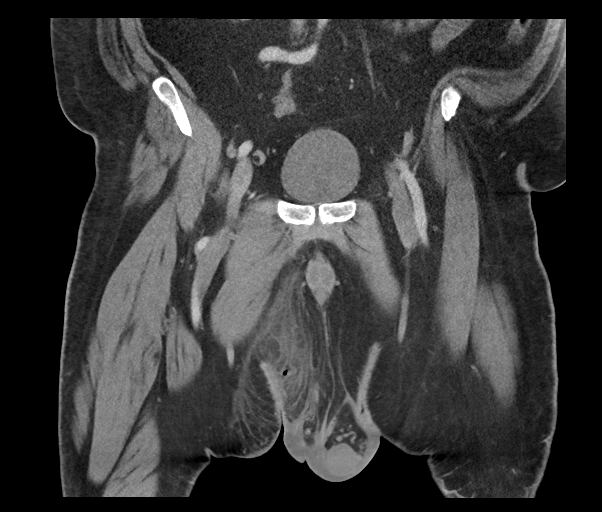

[16 of 46 positions shown; findings below may reference images not displayed]

FINDINGS: Urinary Tract: The bladder lumen measures greater than simple fluid
attenuation. No focal lesions are identified.

Bowel:  The imaged bowel is unremarkable.

Vascular/Lymphatic: The imaged vasculature is unremarkable. There
are a few prominent bilateral inguinal lymph nodes. There is no
pathologic lymphadenopathy in the pelvis.

Reproductive:  The prostate and seminal vesicles are unremarkable.

Other: There is significant inflammatory change in the right groin
extending into the scrotum. There are bilateral hydroceles.

Musculoskeletal: Left femur hardware is partially imaged. There is
no acute osseous abnormality in the pelvis.
IMPRESSION: 1. Findings above consistent with right scrotal/groin cellulitis
with bilateral hydroceles. There are small locules of gas in the
right groin; infection with a gas-forming organism/[place]
gangrene cannot be excluded.
2. The bladder lumen measures greater than simple fluid attenuation.
This may reflect blood or contrast from a prior imaging study.
Correlate with urinalysis as indicated.

These results were called by telephone at the time of interpretation
on 06/05/2021 at [DATE] to provider LISE STAATS , who verbally
acknowledged these results.

## 2021-10-11 ENCOUNTER — Other Ambulatory Visit: Payer: Self-pay | Admitting: Physician Assistant

## 2021-10-11 DIAGNOSIS — Z720 Tobacco use: Secondary | ICD-10-CM

## 2021-12-16 ENCOUNTER — Encounter: Payer: Self-pay | Admitting: Physician Assistant

## 2021-12-30 ENCOUNTER — Ambulatory Visit (INDEPENDENT_AMBULATORY_CARE_PROVIDER_SITE_OTHER): Payer: BC Managed Care – PPO | Admitting: Physician Assistant

## 2021-12-30 ENCOUNTER — Encounter: Payer: Self-pay | Admitting: Physician Assistant

## 2021-12-30 VITALS — BP 138/82 | HR 80 | Temp 97.8°F | Ht 74.0 in | Wt 356.0 lb

## 2021-12-30 DIAGNOSIS — E1169 Type 2 diabetes mellitus with other specified complication: Secondary | ICD-10-CM | POA: Diagnosis not present

## 2021-12-30 DIAGNOSIS — E785 Hyperlipidemia, unspecified: Secondary | ICD-10-CM

## 2021-12-30 DIAGNOSIS — E119 Type 2 diabetes mellitus without complications: Secondary | ICD-10-CM | POA: Diagnosis not present

## 2021-12-30 DIAGNOSIS — I10 Essential (primary) hypertension: Secondary | ICD-10-CM | POA: Diagnosis not present

## 2021-12-30 DIAGNOSIS — F419 Anxiety disorder, unspecified: Secondary | ICD-10-CM | POA: Insufficient documentation

## 2021-12-30 DIAGNOSIS — Z832 Family history of diseases of the blood and blood-forming organs and certain disorders involving the immune mechanism: Secondary | ICD-10-CM | POA: Insufficient documentation

## 2021-12-30 DIAGNOSIS — M1990 Unspecified osteoarthritis, unspecified site: Secondary | ICD-10-CM | POA: Insufficient documentation

## 2021-12-30 DIAGNOSIS — F39 Unspecified mood [affective] disorder: Secondary | ICD-10-CM | POA: Diagnosis not present

## 2021-12-30 LAB — POCT GLYCOSYLATED HEMOGLOBIN (HGB A1C): Hemoglobin A1C: 9.5 % — AB (ref 4.0–5.6)

## 2021-12-30 MED ORDER — DAPAGLIFLOZIN PROPANEDIOL 10 MG PO TABS: 10.0000 mg | ORAL_TABLET | Freq: Every day | ORAL | 2 refills | Status: DC

## 2021-12-30 MED ORDER — AMLODIPINE BESYLATE 10 MG PO TABS: 10.0000 mg | ORAL_TABLET | Freq: Every day | ORAL | 1 refills | Status: DC

## 2021-12-30 NOTE — Assessment & Plan Note (Addendum)
-  Uncontrolled, A1c increased from 7.4 to 9.5 secondary to poor diet and self-discontinuing Ozempic. Discussed with patient should always notify the office if having issues with getting a medication including cost. Will continue Metformin XR 500 mg BID since tolerating and start Farxiga 10 mg. Will discontinue Ozempic. Advised to check with the pharmacy refill status of Metformin which he should still have a refill left, if has any issues with getting medication filled should notify the clinic. Discussed diabetic diet. Will reassess A1c and medication therapy in 3 months. Will request eye exam. ?

## 2021-12-30 NOTE — Assessment & Plan Note (Signed)
-  Last lipid panel: HDL 46, LDL 95 (goal<70). Pt declines statin therapy. Discussed heart healthy diet. Will repeat lipid panel at f/up visit.  ?

## 2021-12-30 NOTE — Progress Notes (Signed)
?Established patient visit ? ? ?Patient: Robert Lewis   DOB: 26-Nov-1965   56 y.o. Male  MRN: [id number] ?Visit Date: 12/30/2021 ? ?Chief Complaint  ?Patient presents with  ? Follow-up  ? Diabetes  ? Hypertension  ? ?Subjective  ?  ?HPI  ?Patient presents for follow-up on diabetes mellitus, hypertension and hyperlipidemia. ? ?Diabetes: Pt denies increased urination or thirst. Pt reports medication compliance with Metformin. States Metformin XR is working better. Not taking Ozempic due to cost. No hypoglycemic events. Not checking glucose at home. Reports drinking sugar free sodas and eating ramen noodles. States had his eye exam in [facility].  ? ?HTN: Pt denies chest pain, palpitations, dizziness or lower extremity swelling. Taking medication as directed without side effects but has ran out of medication. Not drinking as much water. More sodium intake.  ? ?HLD: Pt declines medication therapy. Trying to manage with die but diet has not been good. ? ?Mood: Patient reports his mood has been fine. Not feeling down or depressed. No SI/HI. Taking Wellbutrin SR 100 mg BID. ? ? ?  12/30/2021  ? 10:33 AM 08/26/2021  ?  2:17 PM 07/16/2021  ? 10:57 AM  ?Depression screen PHQ 2/9  ?Decreased Interest '1 1 3  ' ?Down, Depressed, Hopeless 0 1 1  ?PHQ - 2 Score '1 2 4  ' ?Altered sleeping 0 0 1  ?Tired, decreased energy 1 0 3  ?Change in appetite 0 0 3  ?Feeling bad or failure about yourself  0 1 1  ?Trouble concentrating 0 1 1  ?Moving slowly or fidgety/restless 0 0 0  ?Suicidal thoughts 0 0 0  ?PHQ-9 Score '2 4 13  ' ?Difficult doing work/chores Not difficult at all Somewhat difficult   ? ? ?  12/30/2021  ? 10:33 AM 08/26/2021  ?  2:17 PM 07/16/2021  ? 10:57 AM  ?GAD 7 : Generalized Anxiety Score  ?Nervous, Anxious, on Edge 0 0 1  ?Control/stop worrying 0 1 3  ?Worry too much - different things 0 0 3  ?Trouble relaxing 0 0 1  ?Restless 0 0 1  ?Easily annoyed or irritable '1 1 3  ' ?Afraid - awful might happen 0 0 1  ?Total GAD 7 Score  '1 2 13  ' ?Anxiety Difficulty Not difficult at all Not difficult at all   ? ? ? ? ?Medications: ?Outpatient Medications Prior to Visit  ?Medication Sig  ? ACCU-CHEK GUIDE test strip DISPENSE BASED ON PATIENT AND INSURANCE PREFERENCE. USE UP TO FOUR TIMES DAILY AS DIRECTED.  ? Accu-Chek Softclix Lancets lancets See admin instructions.  ? blood glucose meter kit and supplies KIT Dispense based on patient and insurance preference. Use up to four times daily as directed.  ? Blood Pressure KIT 1 each by Does not apply route 2 (two) times daily.  ? buPROPion ER (WELLBUTRIN SR) 100 MG 12 hr tablet TAKE 1 TABLET BY MOUTH TWICE A DAY  ? metFORMIN (GLUCOPHAGE XR) 500 MG 24 hr tablet Take 1 tablet (500 mg total) by mouth 2 (two) times daily with a meal.  ? nicotine (NICODERM CQ - DOSED IN MG/24 HOURS) 21 mg/24hr patch PLACE 1 PATCH ONTO THE SKIN DAILY.  ? [DISCONTINUED] amLODipine (NORVASC) 10 MG tablet Take 1 tablet (10 mg total) by mouth daily.  ? [DISCONTINUED] Semaglutide,0.25 or 0.5MG/DOS, (OZEMPIC, 0.25 OR 0.5 MG/DOSE,) 2 MG/1.5ML SOPN Inject 0.5 mg into the skin once a week.  ? ?No facility-administered medications prior to visit.  ? ? ?Review of Systems ?  Review of Systems:  ?A fourteen system review of systems was performed and found to be positive as per HPI. ? ?Last CBC ?Lab Results  ?Component Value Date  ? WBC 15.8 (H) 09/02/2021  ? HGB 15.4 09/02/2021  ? HCT 45.7 09/02/2021  ? MCV 89 09/02/2021  ? MCH 29.8 09/02/2021  ? RDW 12.4 09/02/2021  ? PLT 227 09/02/2021  ? ?Last metabolic panel ?Lab Results  ?Component Value Date  ? GLUCOSE 128 (H) 09/02/2021  ? NA 138 09/02/2021  ? K 4.1 09/02/2021  ? CL 100 09/02/2021  ? CO2 21 09/02/2021  ? BUN 9 09/02/2021  ? CREATININE 0.98 09/02/2021  ? EGFR 92 09/02/2021  ? CALCIUM 9.0 09/02/2021  ? PHOS 4.2 06/06/2021  ? PROT 7.0 09/02/2021  ? ALBUMIN 4.4 09/02/2021  ? LABGLOB 2.6 09/02/2021  ? AGRATIO 1.7 09/02/2021  ? BILITOT 0.7 09/02/2021  ? ALKPHOS 90 09/02/2021  ? AST 10  09/02/2021  ? ALT 17 09/02/2021  ? ANIONGAP 11 06/06/2021  ? ?Last lipids ?Lab Results  ?Component Value Date  ? CHOL 154 09/02/2021  ? HDL 46 09/02/2021  ? [place] 95 09/02/2021  ? TRIG 66 09/02/2021  ? CHOLHDL 3.3 09/02/2021  ? ?Last hemoglobin A1c ?Lab Results  ?Component Value Date  ? HGBA1C 9.5 (A) 12/30/2021  ? ?Last thyroid functions ?Lab Results  ?Component Value Date  ? TSH 0.464 06/06/2021  ? ?  ?  Objective  ?  ?BP 138/82   Pulse 80   Temp 97.8 ?F (36.6 ?C)   Ht '6\' 2"'  (1.88 m)   Wt (!) 356 lb (161.5 kg)   SpO2 98%   BMI 45.71 kg/m?  ?BP Readings from Last 3 Encounters:  ?12/30/21 138/82  ?08/26/21 (!) 160/82  ?07/16/21 (!) 145/77  ? ?Wt Readings from Last 3 Encounters:  ?12/30/21 (!) 356 lb (161.5 kg)  ?08/26/21 (!) 352 lb (159.7 kg)  ?07/16/21 (!) 355 lb 12.8 oz (161.4 kg)  ? ? ?Physical Exam  ?General:  Well Developed, well nourished, appropriate for stated age.  ?Neuro:  Alert and oriented,  extra-ocular muscles intact  ?HEENT:  Normocephalic, atraumatic, neck supple  ?Skin:  no gross rash, warm, pink. ?Cardiac:  RRR, S1 S2 ?Respiratory: CTA B/L  ?Vascular:  Ext warm, no cyanosis apprec.; cap RF less 2 sec. ?Psych:  No HI/SI, judgement and insight good, Euthymic mood. Full Affect. ? ? ?Results for orders placed or performed in visit on 12/30/21  ?POCT glycosylated hemoglobin (Hb A1C)  ?Result Value Ref Range  ? Hemoglobin A1C 9.5 (A) 4.0 - 5.6 %  ? HbA1c POC (<> result, manual entry)    ? HbA1c, POC (prediabetic range)    ? HbA1c, POC (controlled diabetic range)    ? ? Assessment & Plan  ?  ? ? ?Problem List Items Addressed This Visit   ? ?  ? Cardiovascular and Mediastinum  ? Essential hypertension (Chronic)  ?  -BP elevated on intake. BP repeated and improved. Provided refill of amlodipine 10 mg. Discussed DASH diet. Will continue to monitor. ?  ?  ? Relevant Medications  ? amLODipine (NORVASC) 10 MG tablet  ?  ? Endocrine  ? Type 2 diabetes mellitus without complication, without long-term  current use of insulin (HCC) - Primary (Chronic)  ?  -Uncontrolled, A1c increased from 7.4 to 9.5 secondary to poor diet and self-discontinuing Ozempic. Discussed with patient should always notify the office if having issues with getting a medication including cost. Will continue  Metformin XR 500 mg BID since tolerating and start Farxiga 10 mg. Will discontinue Ozempic. Advised to check with the pharmacy refill status of Metformin which he should still have a refill left, if has any issues with getting medication filled should notify the clinic. Discussed diabetic diet. Will reassess A1c and medication therapy in 3 months. Will request eye exam. ?  ?  ? Relevant Medications  ? dapagliflozin propanediol (FARXIGA) 10 MG TABS tablet  ? Other Relevant Orders  ? POCT glycosylated hemoglobin (Hb A1C) (Completed)  ? Hyperlipidemia associated with type 2 diabetes mellitus ([place])  ?  -Last lipid panel: HDL 46, LDL 95 (goal<70). Pt declines statin therapy. Discussed heart healthy diet. Will repeat lipid panel at f/up visit.  ?  ?  ? Relevant Medications  ? amLODipine (NORVASC) 10 MG tablet  ? dapagliflozin propanediol (FARXIGA) 10 MG TABS tablet  ?  ? Other  ? Mood disorder ([place])  ?  -Stable, continue current medication regimen. See med list. Will continue to monitor. ?  ?  ? ? ?Return in about 3 months (around 03/31/2022) for DM, HTN, HLD and FBW.  ?   ? ? ? ?Lorrene Reid, PA-C  ?[facility] ?[phone] (phone) ?[phone] (fax) ? ?[facility] ?

## 2021-12-30 NOTE — Patient Instructions (Signed)
Heart-Healthy Eating Plan Heart-healthy meal planning includes: Eating less unhealthy fats. Eating more healthy fats. Making other changes in your diet. Talk with your doctor or a diet specialist (dietitian) to create an eating plan that is right for you. What is my plan? Your doctor may recommend an eating plan that includes: Total fat: ______% or less of total calories a day. Saturated fat: ______% or less of total calories a day. Cholesterol: less than _________mg a day. What are tips for following this plan? Cooking Avoid frying your food. Try to bake, boil, grill, or broil it instead. You can also reduce fat by: Removing the skin from poultry. Removing all visible fats from meats. Steaming vegetables in water or broth. Meal planning  At meals, divide your plate into four equal parts: Fill one-half of your plate with vegetables and green salads. Fill one-fourth of your plate with whole grains. Fill one-fourth of your plate with lean protein foods. Eat 4-5 servings of vegetables per day. A serving of vegetables is: 1 cup of raw or cooked vegetables. 2 cups of raw leafy greens. Eat 4-5 servings of fruit per day. A serving of fruit is: 1 medium whole fruit.  cup of dried fruit.  cup of fresh, frozen, or canned fruit.  cup of 100% fruit juice. Eat more foods that have soluble fiber. These are apples, broccoli, carrots, beans, peas, and barley. Try to get 20-30 g of fiber per day. Eat 4-5 servings of nuts, legumes, and seeds per week: 1 serving of dried beans or legumes equals  cup after being cooked. 1 serving of nuts is  cup. 1 serving of seeds equals 1 tablespoon. General information Eat more home-cooked food. Eat less restaurant, buffet, and fast food. Limit or avoid alcohol. Limit foods that are high in starch and sugar. Avoid fried foods. Lose weight if you are overweight. Keep track of how much salt (sodium) you eat. This is important if you have high blood  pressure. Ask your doctor to tell you more about this. Try to add vegetarian meals each week. Fats Choose healthy fats. These include olive oil and canola oil, flaxseeds, walnuts, almonds, and seeds. Eat more omega-3 fats. These include salmon, mackerel, sardines, tuna, flaxseed oil, and ground flaxseeds. Try to eat fish at least 2 times each week. Check food labels. Avoid foods with trans fats or high amounts of saturated fat. Limit saturated fats. These are often found in animal products, such as meats, butter, and cream. These are also found in plant foods, such as palm oil, palm kernel oil, and coconut oil. Avoid foods with partially hydrogenated oils in them. These have trans fats. Examples are stick margarine, some tub margarines, cookies, crackers, and other baked goods. What foods can I eat? Fruits All fresh, canned (in natural juice), or frozen fruits. Vegetables Fresh or frozen vegetables (raw, steamed, roasted, or grilled). Green salads. Grains Most grains. Choose whole wheat and whole grains most of the time. Rice and pasta, including brown rice and pastas made with whole wheat. Meats and other proteins Lean, well-trimmed beef, veal, pork, and lamb. Chicken and turkey without skin. All fish and shellfish. Wild duck, rabbit, pheasant, and venison. Egg whites or low-cholesterol egg substitutes. Dried beans, peas, lentils, and tofu. Seeds and most nuts. Dairy Low-fat or nonfat cheeses, including ricotta and mozzarella. Skim or 1% milk that is liquid, powdered, or evaporated. Buttermilk that is made with low-fat milk. Nonfat or low-fat yogurt. Fats and oils Non-hydrogenated (trans-free) margarines. Vegetable oils, including   soybean, sesame, sunflower, olive, peanut, safflower, corn, canola, and cottonseed. Salad dressings or mayonnaise made with a vegetable oil. Beverages Mineral water. Coffee and tea. Diet carbonated beverages. Sweets and desserts Sherbet, gelatin, and fruit ice.  Small amounts of dark chocolate. Limit all sweets and desserts. Seasonings and condiments All seasonings and condiments. The items listed above may not be a complete list of foods and drinks you can eat. Contact a dietitian for more options. What foods should I avoid? Fruits Canned fruit in heavy syrup. Fruit in cream or butter sauce. Fried fruit. Limit coconut. Vegetables Vegetables cooked in cheese, cream, or butter sauce. Fried vegetables. Grains Breads that are made with saturated or trans fats, oils, or whole milk. Croissants. Sweet rolls. Donuts. High-fat crackers, such as cheese crackers. Meats and other proteins Fatty meats, such as hot dogs, ribs, sausage, bacon, rib-eye roast or steak. High-fat deli meats, such as salami and bologna. Caviar. Domestic duck and goose. Organ meats, such as liver. Dairy Cream, sour cream, cream cheese, and creamed cottage cheese. Whole-milk cheeses. Whole or 2% milk that is liquid, evaporated, or condensed. Whole buttermilk. Cream sauce or high-fat cheese sauce. Yogurt that is made from whole milk. Fats and oils Meat fat, or shortening. Cocoa butter, hydrogenated oils, palm oil, coconut oil, palm kernel oil. Solid fats and shortenings, including bacon fat, salt pork, lard, and butter. Nondairy cream substitutes. Salad dressings with cheese or sour cream. Beverages Regular sodas and juice drinks with added sugar. Sweets and desserts Frosting. Pudding. Cookies. Cakes. Pies. Milk chocolate or white chocolate. Buttered syrups. Full-fat ice cream or ice cream drinks. The items listed above may not be a complete list of foods and drinks to avoid. Contact a dietitian for more information. Summary Heart-healthy meal planning includes eating less unhealthy fats, eating more healthy fats, and making other changes in your diet. Eat a balanced diet. This includes fruits and vegetables, low-fat or nonfat dairy, lean protein, nuts and legumes, whole grains, and  heart-healthy oils and fats. This information is not intended to replace advice given to you by your health care provider. Make sure you discuss any questions you have with your health care provider. Document Revised: 01/17/2021 Document Reviewed: 01/17/2021 Elsevier Patient Education  2022 Elsevier Inc.  

## 2021-12-30 NOTE — Assessment & Plan Note (Signed)
-  Stable, continue current medication regimen. See med list. Will continue to monitor. ?

## 2021-12-30 NOTE — Assessment & Plan Note (Signed)
-  BP elevated on intake. BP repeated and improved. Provided refill of amlodipine 10 mg. Discussed DASH diet. Will continue to monitor. ?

## 2022-01-14 ENCOUNTER — Encounter: Payer: Self-pay | Admitting: Physician Assistant

## 2022-03-28 ENCOUNTER — Other Ambulatory Visit: Payer: Self-pay | Admitting: Physician Assistant

## 2022-03-28 DIAGNOSIS — E119 Type 2 diabetes mellitus without complications: Secondary | ICD-10-CM

## 2022-03-31 ENCOUNTER — Ambulatory Visit: Payer: BC Managed Care – PPO | Admitting: Physician Assistant

## 2022-03-31 DIAGNOSIS — Z23 Encounter for immunization: Secondary | ICD-10-CM

## 2022-03-31 DIAGNOSIS — E119 Type 2 diabetes mellitus without complications: Secondary | ICD-10-CM

## 2022-07-28 ENCOUNTER — Telehealth: Payer: Self-pay

## 2022-07-28 NOTE — Patient Outreach (Signed)
  Care Coordination   07/28/2022 Name: Robert Lewis MRN: [id number] DOB: 09/26/1965   Care Coordination Outreach Attempts:  An unsuccessful telephone outreach was attempted today to offer the patient information about available care coordination services as a benefit of their health plan.   Follow Up Plan:  Additional outreach attempts will be made to offer the patient care coordination information and services.   Encounter Outcome:  No Answer  Care Coordination Interventions Activated:  No   Care Coordination Interventions:  No, not indicated    Jone Baseman, RN, MSN [facility] Care Management Care Management Coordinator Direct Line [phone]

## 2022-08-21 ENCOUNTER — Telehealth: Payer: Self-pay

## 2022-08-21 NOTE — Patient Outreach (Signed)
  Care Coordination   Initial Visit Note   08/21/2022 Name: Robert Lewis MRN: [id number] DOB: [birth year]  Robert Lewis is a 56 y.o. year old male who sees Mayer Masker, [place] for primary care. I spoke with  Robert Lewis by phone today.  What matters to the patients health and wellness today?  none    Goals Addressed             This Visit's Progress    COMPLETED: Care Coordination Activities-No follow up required       Care Coordination Interventions: Advised patient to Annual exam.  Discussed [facility] services and support. Patient decline          SDOH assessments and interventions completed:  Yes  SDOH Interventions Today    Flowsheet Row Most Recent Value  SDOH Interventions   Housing Interventions Intervention Not Indicated  Transportation Interventions Intervention Not Indicated        Care Coordination Interventions:  Yes, provided   Follow up plan: No further intervention required.   Encounter Outcome:  Pt. Visit Completed   Bary Leriche, RN, MSN [facility] Care Management Care Management Coordinator Direct Line [phone]

## 2022-08-21 NOTE — Patient Instructions (Signed)
Visit Information  Thank you for taking time to visit with me today. Please don't hesitate to contact me if I can be of assistance to you.   Following are the goals we discussed today:   Goals Addressed             This Visit's Progress    COMPLETED: Care Coordination Activities-No follow up required       Care Coordination Interventions: Advised patient to Annual exam.  Discussed [facility] services and support. Patient decline          If you are experiencing a Mental Health or Behavioral Health Crisis or need someone to talk to, please call the Suicide and Crisis Lifeline: 988   Patient verbalizes understanding of instructions and care plan provided today and agrees to view in MyChart. Active MyChart status and patient understanding of how to access instructions and care plan via MyChart confirmed with patient.     No further follow up required: decline  Bary Leriche, RN, MSN [facility] Care Management Care Management Coordinator Direct Line [phone]

## 2022-12-02 ENCOUNTER — Encounter: Payer: Self-pay | Admitting: Family Medicine

## 2022-12-02 ENCOUNTER — Ambulatory Visit: Payer: BC Managed Care – PPO | Admitting: Family Medicine

## 2022-12-02 VITALS — BP 173/91 | HR 78 | Resp 18 | Ht 74.0 in | Wt 358.0 lb

## 2022-12-02 DIAGNOSIS — E785 Hyperlipidemia, unspecified: Secondary | ICD-10-CM | POA: Diagnosis not present

## 2022-12-02 DIAGNOSIS — I10 Essential (primary) hypertension: Secondary | ICD-10-CM | POA: Diagnosis not present

## 2022-12-02 DIAGNOSIS — E119 Type 2 diabetes mellitus without complications: Secondary | ICD-10-CM

## 2022-12-02 DIAGNOSIS — E1169 Type 2 diabetes mellitus with other specified complication: Secondary | ICD-10-CM | POA: Diagnosis not present

## 2022-12-02 LAB — POCT UA - MICROALBUMIN
Creatinine, POC: 50 mg/dL
Microalbumin Ur, POC: 30 mg/L

## 2022-12-02 MED ORDER — OZEMPIC (0.25 OR 0.5 MG/DOSE) 2 MG/3ML ~~LOC~~ SOPN: 0.5000 mg | PEN_INJECTOR | SUBCUTANEOUS | 0 refills | Status: DC

## 2022-12-02 MED ORDER — AMLODIPINE BESYLATE 10 MG PO TABS: 10.0000 mg | ORAL_TABLET | Freq: Every day | ORAL | 1 refills | Status: DC

## 2022-12-02 MED ORDER — OZEMPIC (0.25 OR 0.5 MG/DOSE) 2 MG/3ML ~~LOC~~ SOPN: 0.2500 mg | PEN_INJECTOR | SUBCUTANEOUS | 0 refills | Status: DC

## 2022-12-02 NOTE — Assessment & Plan Note (Signed)
Uncontrolled, most recent A1c 9.5, rechecking A1c today.  Patient has not been taking metformin or Farxiga consistently and would like a simplified medication regimen to get his A1c under control.  He remembers that Ozempic has worked well for him in the past and would like to restart.  Advised him to call his insurance after this appointment to make sure that they will cover Ozempic and what information they need in order to do so.  He does meet the criteria of having a diagnosis of type 2 diabetes mellitus, and additionally he would benefit from the reduced risk of cardiovascular events given his history of hypertension and hyperlipidemia.  Sent in prescription for his first month at 0.25 mg weekly as well as prescription for second month titrating up at 0.5 mg weekly.  At follow-up appointment in 1 month, we will discuss if he believes that he will be able to titrate up further/recheck A1c.

## 2022-12-02 NOTE — Patient Instructions (Signed)
Call your insurance company after you leave today to see if they cover [place] and what information they need to do so.  2 weeks from now: Blood pressure check with the nurse after restarting your amlodipine.  4 weeks from now: Follow-up appointment with me to recheck A1c after starting Ozempic.

## 2022-12-02 NOTE — Assessment & Plan Note (Signed)
Patient has not been taking his amlodipine consistently for quite some time.  He did take 1 this morning, but BP was elevated 187/111 upon arrival, still elevated at 173/91 on recheck.  We discussed barriers to consistently taking amlodipine and he finds that it is difficult to remember and/or he does not have the bottle with him while he is on the road.  I sent in a 90-day refill for the amlodipine, and he will split this between his current bottle and the bottle from his last prescription so that he can keep 1 at home and 1 in his bag.  He is hopeful that this will help him to be more consistent as he will always have a bottle accessible when he remembers to take it whether at home or on the road.  He will come back in about 2 weeks for a nurse visit to recheck blood pressure after being back on medication consistently.  Checking CMP for electrolytes and kidney function today.  Will continue to monitor and adjust medication as indicated by follow-up.

## 2022-12-02 NOTE — Progress Notes (Signed)
Established Patient Office Visit  Subjective   Patient ID: Robert Lewis, male    DOB: 1966/08/16  Age: 57 y.o. MRN: [id number]  Chief Complaint  Patient presents with   Medical Management of Chronic Issues    Fasting   Diabetes   Hyperlipidemia   Hypertension    HPI Robert Lewis is a 57 y.o. male presenting today for follow up of hypertension, hyperlipidemia, diabetes.  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 Hypertension: Patient here for follow-up of elevated blood pressure. He is not exercising and is not adherent to low salt diet.   Pt denies chest pain, SOB, dizziness, edema, syncope, fatigue or heart palpitations. Taking amlodipine 10 mg, reports poor compliance with treatment. Denies side effects. Hyperlipidemia: Currently not taking medication, trying to manage with diet alone. The 10-year ASCVD risk score (Arnett DK, et al., 2019) is: 16.3% Diabetes: denies hypoglycemic events, wounds or sores that are not healing well, increased thirst or urination. Denies vision problems, eye exam due.  Has not been taking metformin or Farxiga consistently.  He discontinued his Wellbutrin, but states that his mood has been stable.  ROS Negative unless otherwise noted in HPI   Objective:     BP (!) 173/91 (BP Location: Left Arm, Patient Position: Sitting, Cuff Size: Large)   Pulse 78   Resp 18   Ht '6\' 2"'$  (1.88 m)   Wt (!) 358 lb (162.4 kg)   SpO2 100%   BMI 45.96 kg/m   Physical Exam Constitutional:      General: He is not in acute distress.    Appearance: Normal appearance.  HENT:     Head: Normocephalic and atraumatic.  Eyes:     Extraocular Movements: Extraocular movements intact.     Conjunctiva/sclera: Conjunctivae normal.  Cardiovascular:     Rate and Rhythm: Normal rate and regular rhythm.     Heart sounds: Normal heart  sounds. No murmur heard.    No friction rub. No gallop.  Pulmonary:     Effort: Pulmonary effort is normal.     Breath sounds: Normal breath sounds.  Skin:    General: Skin is warm and dry.  Neurological:     Mental Status: He is alert and oriented to person, place, and time.  Psychiatric:        Mood and Affect: Mood normal.     Assessment & Plan:  Type 2 diabetes mellitus without complication, without long-term current use of insulin (HCC) Assessment & Plan: Uncontrolled, most recent A1c 9.5, rechecking A1c today.  Patient has not been taking metformin or Farxiga consistently and would like a simplified medication regimen to get his A1c under control.  He remembers that Ozempic has worked well for him in the past and would like to restart.  Advised him to call his insurance after this appointment to make sure that they will cover Ozempic and what information they need in order to do so.  He does meet the criteria of having a diagnosis of type 2 diabetes mellitus, and additionally he would benefit from the reduced risk of cardiovascular events given his history of hypertension and hyperlipidemia.  Sent in prescription for his first month at 0.25 mg weekly as well as prescription for second month titrating up at 0.5 mg weekly.  At follow-up appointment in 1 month, we will  discuss if he believes that he will be able to titrate up further/recheck A1c.  Orders: -     POCT UA - Microalbumin -     Hemoglobin A1c; Future -     Ozempic (0.25 or 0.5 MG/DOSE); Inject 0.25 mg into the skin once a week.  Dispense: 3 mL; Refill: 0 -     Ozempic (0.25 or 0.5 MG/DOSE); Inject 0.5 mg into the skin once a week.  Dispense: 3 mL; Refill: 0  Essential hypertension Assessment & Plan: Patient has not been taking his amlodipine consistently for quite some time.  He did take 1 this morning, but BP was elevated 187/111 upon arrival, still elevated at 173/91 on recheck.  We discussed barriers to consistently taking  amlodipine and he finds that it is difficult to remember and/or he does not have the bottle with him while he is on the road.  I sent in a 90-day refill for the amlodipine, and he will split this between his current bottle and the bottle from his last prescription so that he can keep 1 at home and 1 in his bag.  He is hopeful that this will help him to be more consistent as he will always have a bottle accessible when he remembers to take it whether at home or on the road.  He will come back in about 2 weeks for a nurse visit to recheck blood pressure after being back on medication consistently.  Checking CMP for electrolytes and kidney function today.  Will continue to monitor and adjust medication as indicated by follow-up.  Orders: -     CBC with Differential/Platelet; Future -     Comprehensive metabolic panel; Future -     amLODIPine Besylate; Take 1 tablet (10 mg total) by mouth daily.  Dispense: 90 tablet; Refill: 1  Hyperlipidemia associated with type 2 diabetes mellitus ([place]) Assessment & Plan: Last lipid panel: HDL 46, LDL 95 (goal <70).  Patient declined statin therapy, would like to manage with diet and exercise.  Encourage heart healthy diet.  Repeating lipid panel today.  I am hopeful that lipids will continue to improve upon starting Ozempic, but we will revisit the conversation of medication therapy because of his ASCVD risk depending on lipid panel results.  Orders: -     Lipid panel; Future    Return in about 2 weeks (around 12/16/2022) for blood pressure check (nurse visit, no charge).  Return in about 4 weeks for follow-up visit for Ozempic.   Velva Harman, PA

## 2022-12-02 NOTE — Assessment & Plan Note (Signed)
Last lipid panel: HDL 46, LDL 95 (goal <70).  Patient declined statin therapy, would like to manage with diet and exercise.  Encourage heart healthy diet.  Repeating lipid panel today.  I am hopeful that lipids will continue to improve upon starting Ozempic, but we will revisit the conversation of medication therapy because of his ASCVD risk depending on lipid panel results.

## 2022-12-03 LAB — COMPREHENSIVE METABOLIC PANEL
ALT: 20 IU/L (ref 0–44)
AST: 11 IU/L (ref 0–40)
Albumin/Globulin Ratio: 1.8 (ref 1.2–2.2)
Albumin: 4.4 g/dL (ref 3.8–4.9)
Alkaline Phosphatase: 115 IU/L (ref 44–121)
BUN/Creatinine Ratio: 14 (ref 9–20)
BUN: 12 mg/dL (ref 6–24)
Bilirubin Total: 0.4 mg/dL (ref 0.0–1.2)
CO2: 18 mmol/L — ABNORMAL LOW (ref 20–29)
Calcium: 9.5 mg/dL (ref 8.7–10.2)
Chloride: 102 mmol/L (ref 96–106)
Creatinine, Ser: 0.83 mg/dL (ref 0.76–1.27)
Globulin, Total: 2.4 g/dL (ref 1.5–4.5)
Glucose: 333 mg/dL — ABNORMAL HIGH (ref 70–99)
Potassium: 4.5 mmol/L (ref 3.5–5.2)
Sodium: 139 mmol/L (ref 134–144)
Total Protein: 6.8 g/dL (ref 6.0–8.5)
eGFR: 103 mL/min/{1.73_m2} (ref >59)

## 2022-12-03 LAB — HEMOGLOBIN A1C
Est. average glucose Bld gHb Est-mCnc: 272 mg/dL
Hgb A1c MFr Bld: 11.1 % — ABNORMAL HIGH (ref 4.8–5.6)

## 2022-12-03 LAB — LIPID PANEL
Chol/HDL Ratio: 4 ratio (ref 0.0–5.0)
Cholesterol, Total: 198 mg/dL (ref 100–199)
HDL: 49 mg/dL (ref >39)
LDL Chol Calc (NIH): 133 mg/dL — ABNORMAL HIGH (ref 0–99)
Triglycerides: 87 mg/dL (ref 0–149)
VLDL Cholesterol Cal: 16 mg/dL (ref 5–40)

## 2022-12-03 LAB — CBC WITH DIFFERENTIAL/PLATELET
Basophils Absolute: 0.1 10*3/uL (ref 0.0–0.2)
Basos: 1 %
EOS (ABSOLUTE): 0.1 10*3/uL (ref 0.0–0.4)
Eos: 1 %
Hematocrit: 46.6 % (ref 37.5–51.0)
Hemoglobin: 16 g/dL (ref 13.0–17.7)
Immature Grans (Abs): 0.1 10*3/uL (ref 0.0–0.1)
Immature Granulocytes: 1 %
Lymphocytes Absolute: 2.4 10*3/uL (ref 0.7–3.1)
Lymphs: 23 %
MCH: 30.8 pg (ref 26.6–33.0)
MCHC: 34.3 g/dL (ref 31.5–35.7)
MCV: 90 fL (ref 79–97)
Monocytes Absolute: 0.7 10*3/uL (ref 0.1–0.9)
Monocytes: 6 %
Neutrophils Absolute: 7 10*3/uL (ref 1.4–7.0)
Neutrophils: 68 %
Platelets: 249 10*3/uL (ref 150–450)
RBC: 5.19 x10E6/uL (ref 4.14–5.80)
RDW: 12.1 % (ref 11.6–15.4)
WBC: 10.3 10*3/uL (ref 3.4–10.8)

## 2022-12-15 ENCOUNTER — Ambulatory Visit (INDEPENDENT_AMBULATORY_CARE_PROVIDER_SITE_OTHER): Payer: BC Managed Care – PPO | Admitting: Family Medicine

## 2022-12-15 VITALS — BP 156/77 | HR 95 | Ht 74.0 in | Wt 360.0 lb

## 2022-12-15 DIAGNOSIS — I10 Essential (primary) hypertension: Secondary | ICD-10-CM

## 2022-12-29 ENCOUNTER — Other Ambulatory Visit: Payer: Self-pay | Admitting: Family Medicine

## 2022-12-29 ENCOUNTER — Ambulatory Visit: Payer: BC Managed Care – PPO | Admitting: Family Medicine

## 2022-12-29 ENCOUNTER — Other Ambulatory Visit (HOSPITAL_COMMUNITY): Payer: Self-pay

## 2022-12-29 ENCOUNTER — Encounter: Payer: Self-pay | Admitting: Family Medicine

## 2022-12-29 VITALS — BP 151/85 | HR 85 | Resp 18 | Ht 74.0 in | Wt 357.0 lb

## 2022-12-29 DIAGNOSIS — I10 Essential (primary) hypertension: Secondary | ICD-10-CM | POA: Diagnosis not present

## 2022-12-29 DIAGNOSIS — E119 Type 2 diabetes mellitus without complications: Secondary | ICD-10-CM

## 2022-12-29 DIAGNOSIS — Z1212 Encounter for screening for malignant neoplasm of rectum: Secondary | ICD-10-CM | POA: Diagnosis not present

## 2022-12-29 DIAGNOSIS — Z1211 Encounter for screening for malignant neoplasm of colon: Secondary | ICD-10-CM

## 2022-12-29 LAB — POCT GLYCOSYLATED HEMOGLOBIN (HGB A1C): HbA1c POC (<> result, manual entry): 11 % (ref 4.0–5.6)

## 2022-12-29 MED ORDER — TIRZEPATIDE 2.5 MG/0.5ML ~~LOC~~ SOAJ
2.5000 mg | SUBCUTANEOUS | 1 refills | Status: DC
  Filled 2022-12-29 – 2022-12-31 (×3): qty 2, 28d supply, fill #0

## 2022-12-29 NOTE — Assessment & Plan Note (Addendum)
Most recent A1c 11.1, A1c today 11.0.  He is taking metformin 1000 mg twice a day and Ozempic.  Ozempic has been causing brain fog.  Because of his uncontrolled diabetes with an A1c of 11.0 and Ozempic being failed, we will recommend starting Mounjaro at this point.

## 2022-12-29 NOTE — Progress Notes (Signed)
   Established Patient Office Visit  Subjective   Patient ID: Robert Lewis, male    DOB: [birth year]  Age: 57 y.o. MRN: [id number]  Chief Complaint  Patient presents with   Medical Management of Chronic Issues   Diabetes    HPI Robert Lewis is a 57 y.o. male presenting today for follow up of diabetes.  He states that he has not been taking amlodipine consistently for his blood pressure.  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 Diabetes: denies hypoglycemic events, wounds or sores that are not healing well, increased thirst or urination.  Taking metformin and Ozempic as prescribed without any side effects other than some brain fog.  ROS Negative unless otherwise noted in HPI   Objective:     BP (!) 151/85 (BP Location: Right Arm, Patient Position: Sitting, Cuff Size: Large)   Pulse 85   Resp 18   Ht 6\' 2"  (1.88 m)   Wt (!) 357 lb (161.9 kg)   SpO2 95%   BMI 45.84 kg/m   Physical Exam Vitals reviewed.  Constitutional:      General: He is not in acute distress.    Appearance: Normal appearance. He is not ill-appearing.  HENT:     Head: Normocephalic and atraumatic.     Nose: Nose normal.  Eyes:     Extraocular Movements: Extraocular movements intact.     Conjunctiva/sclera: Conjunctivae normal.  Pulmonary:     Effort: Pulmonary effort is normal.  Musculoskeletal:     Cervical back: Normal range of motion.  Skin:    Coloration: Skin is not jaundiced or pale.     Findings: No bruising.  Neurological:     Mental Status: He is alert and oriented to person, place, and time.  Psychiatric:        Mood and Affect: Mood normal.      Results for orders placed or performed in visit on 12/29/22  POCT HgB A1C  Result Value Ref Range   Hemoglobin A1C     HbA1c POC (<> result, manual entry) 11.0 4.0 - 5.6 %   HbA1c, POC (prediabetic range)     HbA1c, POC (controlled diabetic range)       Assessment & Plan:  Essential hypertension Assessment & Plan: Blood pressure elevated in the office.  Patient endorses that he has not been taking his blood pressure medicine consistently.  We discussed barriers to consistently and brainstormed that he will keep his amlodipine bottle next to his metformin bottle since he is very consistent taking that.  Recommended ambulatory blood pressure monitoring.  Will continue to monitor.   Type 2 diabetes mellitus without complication, without long-term current use of insulin Assessment & Plan: Most recent A1c 11.1, A1c today 11.0.  He is taking metformin 1000 mg twice a day and Ozempic.  Ozempic has been causing brain fog.  Because of his uncontrolled diabetes with an A1c of 11.0 and Ozempic being failed, we will recommend starting Mounjaro at this point.  Orders: -     POCT glycosylated hemoglobin (Hb A1C) -     Tirzepatide; Inject 2.5 mg into the skin once a week.  Dispense: 2 mL; Refill: 1  Screening for colorectal cancer -     Cologuard    Return in about 6 weeks (around 02/09/2023) for follow-up for HTN, DM, HLD.    Melida Quitter, PA

## 2022-12-29 NOTE — Assessment & Plan Note (Signed)
Blood pressure elevated in the office.  Patient endorses that he has not been taking his blood pressure medicine consistently.  We discussed barriers to consistently and brainstormed that he will keep his amlodipine bottle next to his metformin bottle since he is very consistent taking that.  Recommended ambulatory blood pressure monitoring.  Will continue to monitor.

## 2022-12-30 ENCOUNTER — Encounter: Payer: Self-pay | Admitting: Family Medicine

## 2022-12-31 ENCOUNTER — Other Ambulatory Visit (HOSPITAL_COMMUNITY): Payer: Self-pay

## 2022-12-31 ENCOUNTER — Other Ambulatory Visit: Payer: Self-pay

## 2023-01-05 ENCOUNTER — Ambulatory Visit: Payer: BC Managed Care – PPO | Admitting: Family Medicine

## 2023-02-09 ENCOUNTER — Ambulatory Visit: Payer: BC Managed Care – PPO | Admitting: Family Medicine

## 2023-03-06 ENCOUNTER — Encounter: Payer: Self-pay | Admitting: *Deleted

## 2023-03-06 NOTE — Progress Notes (Signed)
  [facility] Quality Team Note  Name: Robert Lewis Date of Birth: 1966-01-08 MRN: [id number] Date: 03/06/2023  [facility] Quality Team has reviewed this patient's chart, please see recommendations below:  [facility] Quality Other; I called pt to try to help arrange rescheduling his missed appointment.  He has open gaps for blood pressure, A1C, and colon screening.  He declined rescheduling appointment right now.  I also asked if he completed the cologuard kit.  He has not done it yet.  Pt informed me that he would call back when he is ready to schedule appointment.  Routing as FYI.

## 2023-04-06 ENCOUNTER — Encounter: Payer: Self-pay | Admitting: Family Medicine

## 2023-04-06 ENCOUNTER — Ambulatory Visit: Payer: BC Managed Care – PPO | Admitting: Family Medicine

## 2023-04-06 ENCOUNTER — Telehealth: Payer: Self-pay | Admitting: *Deleted

## 2023-04-06 VITALS — BP 180/85 | HR 97 | Resp 18 | Ht 74.0 in | Wt 364.0 lb

## 2023-04-06 DIAGNOSIS — Z01818 Encounter for other preprocedural examination: Secondary | ICD-10-CM | POA: Diagnosis not present

## 2023-04-06 DIAGNOSIS — E119 Type 2 diabetes mellitus without complications: Secondary | ICD-10-CM | POA: Diagnosis not present

## 2023-04-06 DIAGNOSIS — E785 Hyperlipidemia, unspecified: Secondary | ICD-10-CM

## 2023-04-06 DIAGNOSIS — E1169 Type 2 diabetes mellitus with other specified complication: Secondary | ICD-10-CM | POA: Diagnosis not present

## 2023-04-06 DIAGNOSIS — I10 Essential (primary) hypertension: Secondary | ICD-10-CM | POA: Diagnosis not present

## 2023-04-06 DIAGNOSIS — G4733 Obstructive sleep apnea (adult) (pediatric): Secondary | ICD-10-CM

## 2023-04-06 LAB — POCT GLYCOSYLATED HEMOGLOBIN (HGB A1C): HbA1c POC (<> result, manual entry): 11.2 % (ref 4.0–5.6)

## 2023-04-06 MED ORDER — LISINOPRIL 10 MG PO TABS: 10.0000 mg | ORAL_TABLET | Freq: Every day | ORAL | 3 refills | Status: DC

## 2023-04-06 MED ORDER — TIRZEPATIDE 2.5 MG/0.5ML ~~LOC~~ SOAJ: 2.5000 mg | SUBCUTANEOUS | 1 refills | Status: DC

## 2023-04-06 MED ORDER — DAPAGLIFLOZIN PROPANEDIOL 10 MG PO TABS: 10.0000 mg | ORAL_TABLET | Freq: Every day | ORAL | 2 refills | Status: DC

## 2023-04-06 MED ORDER — AMLODIPINE BESYLATE 10 MG PO TABS: 10.0000 mg | ORAL_TABLET | Freq: Every day | ORAL | 1 refills | Status: DC

## 2023-04-06 MED ORDER — METFORMIN HCL ER 500 MG PO TB24: 500.0000 mg | ORAL_TABLET | Freq: Two times a day (BID) | ORAL | 0 refills | Status: DC

## 2023-04-06 NOTE — Progress Notes (Signed)
Preoperative Evaluation   Subjective:    Robert Lewis is a 57 y.o. male who presents to the office today for a preoperative consultation at the request of surgeon Waylan Rocher MD who plans on performing left distal biceps tendon repair on July 17.  Patient admits that he is "pissed off" needing to come in for an evaluation today.  He had his A1c checked in April at the [facility] to be cleared for work which requires A1c <10.0.  He has not presented to PCP for routine care since that time.  This consultation is requested for the specific conditions prompting preoperative evaluation (i.e. because of potential affect on operative risk): Poorly controlled hypertension, poorly controlled diabetes melitis, morbid obesity, current smoker, OSA on CPAP. Planned anesthesia: general. The patient has the following known anesthesia issues:  None, including no familial history of anesthesia issues . Patients bleeding risk: no recent abnormal bleeding, no remote history of abnormal bleeding, and use of Ca-channel blockers (see med list). Patient does not have objections to receiving blood products if needed.  The following portions of the patient's history were reviewed and updated as appropriate: allergies, current medications, past family history, past medical history, past social history, past surgical history, and problem list.  Review of Systems  Constitutional:  Negative for chills, fever, malaise/fatigue and weight loss.  HENT: Negative.  Negative for congestion, ear discharge, ear pain and sore throat.   Eyes: Negative.  Negative for blurred vision, double vision, pain and discharge.  Respiratory: Negative.  Negative for cough and shortness of breath.   Cardiovascular: Negative.  Negative for chest pain, palpitations and claudication.  Gastrointestinal: Negative.  Negative for abdominal pain, nausea and vomiting.  Genitourinary: Negative.   Musculoskeletal: Negative.   Neurological:  Negative.  Negative for dizziness and headaches.  Endo/Heme/Allergies: Negative.  Negative for polydipsia. Does not bruise/bleed easily.    Objective:  BP (!) 156/89 (BP Location: Right Arm, Patient Position: Sitting, Cuff Size: Large)   Pulse 85   Resp 18   Ht 6\' 2"  (1.88 m)   Wt (!) 364 lb (165.1 kg)   SpO2 98%   BMI 46.73 kg/m   Physical Exam Constitutional:      General: He is not in acute distress.    Appearance: Normal appearance.     Comments: Morbid obesity: BMI 46.73 with HTN, HLD, T2DM  HENT:     Head: Normocephalic and atraumatic.  Eyes:     Extraocular Movements: Extraocular movements intact.     Conjunctiva/sclera: Conjunctivae normal.     Pupils: Pupils are equal, round, and reactive to light.  Cardiovascular:     Rate and Rhythm: Normal rate and regular rhythm.     Heart sounds: Normal heart sounds. No murmur heard.    No friction rub. No gallop.  Pulmonary:     Effort: Pulmonary effort is normal. No respiratory distress.     Breath sounds: Normal breath sounds. No wheezing, rhonchi or rales.  Abdominal:     General: There is no distension.     Tenderness: There is no abdominal tenderness.  Musculoskeletal:        General: Normal range of motion.     Cervical back: Normal range of motion.  Skin:    General: Skin is warm and dry.  Neurological:     Mental Status: He is alert and oriented to person, place, and time.  Psychiatric:        Mood and Affect: Mood normal. Affect is  angry.        Behavior: Behavior is agitated.   Cardiographics ECG: normal sinus rhythm, no blocks or conduction defects, no ischemic changes and     Echocardiogram: not done  Imaging Chest x-ray:  not done    Lab Review  Lab Results  Component Value Date   NA 139 12/02/2022   K 4.5 12/02/2022   CL 102 12/02/2022   CO2 18 (L) 12/02/2022   BUN 12 12/02/2022   CREATININE 0.83 12/02/2022   GLUCOSE 333 (H) 12/02/2022   GLUCOSE 359 (H) 06/06/2021   CALCIUM 9.5 12/02/2022    Lab Results  Component Value Date   WBC 10.3 12/02/2022   WBC 9.9 06/08/2021   HGB 16.0 12/02/2022   HCT 46.6 12/02/2022   MCV 90 12/02/2022   PLT 249 12/02/2022   Lab Results  Component Value Date   CHOL 198 12/02/2022   TRIG 87 12/02/2022   HDL 49 12/02/2022    Results for orders placed or performed in visit on 04/06/23  POCT HgB A1C  Result Value Ref Range   Hemoglobin A1C     HbA1c POC (<> result, manual entry) 11.2 4.0 - 5.6 %   HbA1c, POC (prediabetic range)     HbA1c, POC (controlled diabetic range)      Assessment:      57 y.o. male with planned surgery as above.   Known risk factors for perioperative complications: poorly controlled type 2 diabetes mellitus, poorly controlled hypertension   Difficulty with intubation is not anticipated.  Cardiac Risk Estimation: per the Revised Cardiac Risk Index (Circ. 100:1043, 1999), the patient's risk factors for cardiac complications does not include "high-risk" surgery (intraperitoneal, intrathoracic, or suprainguinal vascular), history of cerebrovascular disease, history of congestive heart failure, history of ischemic heart disease, on insulin, or serum Cr > 2.0 mg/dL, putting him in: RCI Risk Class 0  ASA Classification: ASA III with a history of poorly controlled diabetes, poorly controlled hypertension, morbid obesity with BMI 46.73, current smoker ASA Class  Examples  ASA I A normal healthy patient  Healthy (no acute or chronic disease), normal BMI percentile for age   ASA II A patient with mild systemic disease  Mild diseases only without substantive functional limitations. Current smoker, social alcohol drinker, pregnancy, obesity (30<BMI<40), well-controlled DM/HTN, mild lung disease   ASA III A patient with severe systemic disease  Substantive functional limitations; One or more moderate to severe diseases. Poorly controlled DM or HTN, COPD, morbid obesity (BMI ?40), active hepatitis, alcohol dependence or abuse,  implanted pacemaker, moderate reduction of ejection fraction, ESRD undergoing regularly scheduled dialysis, history (>3 months) of MI, CVA, TIA, or CAD/stents.   ASA IV A patient with severe systemic disease that is a constant threat to life  Recent (<3 months) MI, CVA, TIA or CAD/stents, ongoing cardiac ischemia or severe valve dysfunction, severe reduction of ejection fraction, shock, sepsis, DIC, ARD or ESRD not undergoing regularly scheduled dialysis    Current medications which may produce withdrawal symptoms if withheld perioperatively: none    Plan:   I cannot clear this patient for surgery due to his high risk with currently poorly controlled diabetes, poorly controlled hypertension, morbid obesity with BMI 46.73, current smoker.  We discussed that I cannot clear him surgically at this time.  Over the next 2 weeks, if we can gain better control of blood pressure and blood sugars, then it may be possible.  Adding lisinopril 10 mg daily for blood pressure and renal  protection.  Recommend restarting Mounjaro and checking fasting blood glucose for the next 2 weeks before returning.  Most recent fasting glucose on 12/02/2022 was 333.  Patient is aggravated in the office today.  When discussing that evaluation is to determine risk factors for surgery and whether it can proceed he states "give me whatever papers I need to sign to move forward with this.  I do not care."  I am also sending a message directly to his surgeon to make him aware of risk factors including blood pressure and A1c in the office today.  Patient was very upset to learn that I could not clear him without better blood pressure and blood sugar control.  He stated " this is going to cause permanent nerve damage.  You know I am going to be deformed, right?"  I reiterated that while I would like him to have an operation to repair this injury, I cannot recommend clearance because it would not be safe.  In the future, may consider the  following management:  1. Preoperative workup as follows: POC A1C, 11.2 today. 2. Change in medication regimen before surgery: none, continue medication regimen including morning of surgery, with sip of water. 3. Prophylaxis for cardiac events with perioperative beta-blockers: should be considered, specific regimen per anesthesia. 4. Invasive hemodynamic monitoring perioperatively: at the discretion of anesthesiologist. 5. Deep vein thrombosis prophylaxis postoperatively:regimen to be chosen by surgical team. 6. Surveillance for postoperative MI with ECG immediately postoperatively and on postoperative days 1 and 2 AND troponin levels 24 hours postoperatively and on day 4 or hospital discharge (whichever comes first): at the discretion of anesthesiologist. 7. Other measures: Preoperative tobacco abstention x 60 days. Improved hypertensive and glycemic control    Saralyn Pilar PA-C

## 2023-04-06 NOTE — Telephone Encounter (Signed)
Pt calling requesting surgery clearance to be completed.  Received fax from [facility] and this requires an appointment. Appointment scheduled and form handed to provider.

## 2023-04-06 NOTE — Patient Instructions (Addendum)
I cannot in good conscience clear you for surgery with such a high risk for something going wrong.    I am recommending adding lisinopril to gain better control of your blood pressure.  Keep a log of your fasting blood sugar levels over the next 2 weeks after restarting Mounjaro, metformin, and [place].  I also highly recommend quitting smoking in the next couple weeks as this is another factor that puts you at higher risk going into surgery.

## 2023-04-21 ENCOUNTER — Encounter: Payer: Self-pay | Admitting: Family Medicine

## 2023-04-21 ENCOUNTER — Ambulatory Visit: Payer: BC Managed Care – PPO | Admitting: Family Medicine

## 2023-04-21 VITALS — BP 151/88 | HR 101 | Ht 74.0 in | Wt 366.1 lb

## 2023-04-21 DIAGNOSIS — S46212D Strain of muscle, fascia and tendon of other parts of biceps, left arm, subsequent encounter: Secondary | ICD-10-CM

## 2023-04-21 DIAGNOSIS — S46219A Strain of muscle, fascia and tendon of other parts of biceps, unspecified arm, initial encounter: Secondary | ICD-10-CM | POA: Insufficient documentation

## 2023-04-21 NOTE — Progress Notes (Unsigned)
   Established Patient Office Visit  Subjective   Patient ID: Robert Lewis, male    DOB: May 16, 1966  Age: 57 y.o. MRN: [id number]  Chief Complaint  Patient presents with   Pre-op Exam   Hypertension    HPI Patient is here today for reevaluation of appropriateness for surgery.  Patient is a patient of [facility] and is having left distal bicep tendon repair.  Patient states that he saw his orthopedist today.  Was told that the surgery would be done under local anesthesia only not general anesthesia.  Patient has been compliant with his blood pressure medication blood pressure is better today.  Patient is a non-smoker though he chews tobacco.  We discussed his uncontrolled diabetes and obesity as potential reasons why his surgeon would not want to do a elective surgery.  But since it appears his orthopedist is okay with this (likely because it is under local anesthesia and relatively minor procedure) then there is no reason for me to deny him the surgery.  Especially since this is a surgery in which the sooner it is performed the better the outcome.   The 10-year ASCVD risk score (Arnett DK, et al., 2019) is: 16.2%   {History (Optional):23778}  ROS    Objective:     BP (!) 151/88 (BP Location: Right Arm, Patient Position: Sitting, Cuff Size: Large)   Pulse (!) 101   Ht 6\' 2"  (1.88 m)   Wt (!) 366 lb 1.9 oz (166.1 kg)   SpO2 95%   BMI 47.01 kg/m  {Vitals History (Optional):23777}  Physical Exam General: Alert and oriented.  Obese male CV: Regular rhythm Pulmonary: Lungs clear bilaterally GI: Soft, large pannus MSK: Visible left bicep tendon rupture.   No results found for any visits on 04/21/23.  {Labs (Optional):23779}      Assessment & Plan:   Rupture of left biceps tendon, subsequent encounter Assessment & Plan: Patient here today for reevaluation of surgical clearance for bicep tendon repair.  Per the patient this will be done under local anesthesia,  not general anesthesia.  We discussed how his uncontrolled diabetes, blood pressure, obesity make him at an above average risk compared to the general population for complications from surgery.  This includes infection, poor healing, etc.  I advised him that if the patient and his orthopedic surgeon are okay with accepting an increased risk of complication then from my standpoint he is okay to have the surgery.  It would be several months before the patient's A1c was under control and even longer for him to have a BMI that is more appropriate.  Given that bicep tendon repairs have better outcomes when they are performed closer to the point of rupture, delaying his surgery could result in worse outcome or the inability to have the surgery at all.  Will forward this to [facility] at [address] .  Patient was unsure the name of the doctor he saw at the office today.      Return if symptoms worsen or fail to improve.    Sandre Kitty, MD

## 2023-04-21 NOTE — Patient Instructions (Addendum)
It was nice to see you today,  We addressed the following topics today: - I will send my note to the orthopedists office.   - as far as I am concerned if the surgeon is okay with your A1c and bmi then you should be able to have this surgery.    Have a great day,  Frederic Jericho, MD

## 2023-04-21 NOTE — Assessment & Plan Note (Signed)
Patient here today for reevaluation of surgical clearance for bicep tendon repair.  Per the patient this will be done under local anesthesia, not general anesthesia.  We discussed how his uncontrolled diabetes, blood pressure, obesity make him at an above average risk compared to the general population for complications from surgery.  This includes infection, poor healing, etc.  I advised him that if the patient and his orthopedic surgeon are okay with accepting an increased risk of complication then from my standpoint he is okay to have the surgery.  It would be several months before the patient's A1c was under control and even longer for him to have a BMI that is more appropriate.  Given that bicep tendon repairs have better outcomes when they are performed closer to the point of rupture, delaying his surgery could result in worse outcome or the inability to have the surgery at all.  Will forward this to [facility] at [address] .  Patient was unsure the name of the doctor he saw at the office today.

## 2023-05-15 ENCOUNTER — Telehealth: Payer: Self-pay | Admitting: *Deleted

## 2023-05-15 NOTE — Telephone Encounter (Signed)
LVM to call office to inform him of below and see which med he would like

## 2023-05-15 NOTE — Telephone Encounter (Signed)
Pt calling stating that he had surgery on 8/7 and is now having some anxiety.  He wanted to know if he could have some medication sent in or does he need an appointment.  He thought he had been on something before but wasn't sure it was for anxiety. PCP does not have anything until 10/16 Please advise.

## 2023-05-15 NOTE — Telephone Encounter (Signed)
I reviewed his historical meds, and I saw that in the past he has taken Effexor (generic name is venlafaxine) and at another time he was taking Wellbutrin (generic name is bupropion).  If he would like to restart 1 of these medications, I can send that in today and we can see how it is going at his next appointment in October.

## 2023-05-18 NOTE — Telephone Encounter (Signed)
2nd attempt to reach pt, LVM to call office back

## 2023-05-19 DIAGNOSIS — M1990 Unspecified osteoarthritis, unspecified site: Secondary | ICD-10-CM | POA: Diagnosis not present

## 2023-05-19 DIAGNOSIS — I10 Essential (primary) hypertension: Secondary | ICD-10-CM | POA: Diagnosis not present

## 2023-05-19 DIAGNOSIS — R000 Tachycardia, unspecified: Secondary | ICD-10-CM | POA: Diagnosis not present

## 2023-05-19 DIAGNOSIS — Z79891 Long term (current) use of opiate analgesic: Secondary | ICD-10-CM | POA: Diagnosis not present

## 2023-05-19 DIAGNOSIS — Z7985 Long-term (current) use of injectable non-insulin antidiabetic drugs: Secondary | ICD-10-CM | POA: Diagnosis not present

## 2023-05-19 DIAGNOSIS — G4733 Obstructive sleep apnea (adult) (pediatric): Secondary | ICD-10-CM | POA: Diagnosis not present

## 2023-05-19 DIAGNOSIS — Z6841 Body Mass Index (BMI) 40.0 and over, adult: Secondary | ICD-10-CM | POA: Diagnosis not present

## 2023-05-19 DIAGNOSIS — E785 Hyperlipidemia, unspecified: Secondary | ICD-10-CM | POA: Diagnosis not present

## 2023-05-19 DIAGNOSIS — Z9989 Dependence on other enabling machines and devices: Secondary | ICD-10-CM | POA: Diagnosis not present

## 2023-05-19 DIAGNOSIS — F419 Anxiety disorder, unspecified: Secondary | ICD-10-CM | POA: Diagnosis not present

## 2023-05-19 DIAGNOSIS — D72829 Elevated white blood cell count, unspecified: Secondary | ICD-10-CM | POA: Diagnosis not present

## 2023-05-19 DIAGNOSIS — E119 Type 2 diabetes mellitus without complications: Secondary | ICD-10-CM | POA: Diagnosis not present

## 2023-05-19 DIAGNOSIS — R109 Unspecified abdominal pain: Secondary | ICD-10-CM | POA: Diagnosis not present

## 2023-05-19 DIAGNOSIS — E669 Obesity, unspecified: Secondary | ICD-10-CM | POA: Diagnosis not present

## 2023-05-19 DIAGNOSIS — Z7984 Long term (current) use of oral hypoglycemic drugs: Secondary | ICD-10-CM | POA: Diagnosis not present

## 2023-05-19 DIAGNOSIS — K567 Ileus, unspecified: Secondary | ICD-10-CM | POA: Diagnosis not present

## 2023-05-22 ENCOUNTER — Emergency Department (HOSPITAL_COMMUNITY): Payer: BC Managed Care – PPO

## 2023-05-22 ENCOUNTER — Telehealth: Payer: Self-pay

## 2023-05-22 ENCOUNTER — Encounter (HOSPITAL_COMMUNITY): Payer: Self-pay

## 2023-05-22 ENCOUNTER — Other Ambulatory Visit: Payer: Self-pay

## 2023-05-22 ENCOUNTER — Inpatient Hospital Stay (HOSPITAL_COMMUNITY)
Admission: EM | Admit: 2023-05-22 | Discharge: 2023-05-25 | DRG: 389 | Disposition: A | Discharge status: Home / Self Care | Payer: BC Managed Care – PPO | Attending: Family Medicine | Admitting: Family Medicine

## 2023-05-22 DIAGNOSIS — Z8042 Family history of malignant neoplasm of prostate: Secondary | ICD-10-CM

## 2023-05-22 DIAGNOSIS — Z7985 Long-term (current) use of injectable non-insulin antidiabetic drugs: Secondary | ICD-10-CM

## 2023-05-22 DIAGNOSIS — E876 Hypokalemia: Secondary | ICD-10-CM | POA: Diagnosis present

## 2023-05-22 DIAGNOSIS — K56609 Unspecified intestinal obstruction, unspecified as to partial versus complete obstruction: Secondary | ICD-10-CM | POA: Diagnosis not present

## 2023-05-22 DIAGNOSIS — E860 Dehydration: Secondary | ICD-10-CM | POA: Diagnosis present

## 2023-05-22 DIAGNOSIS — Z8249 Family history of ischemic heart disease and other diseases of the circulatory system: Secondary | ICD-10-CM

## 2023-05-22 DIAGNOSIS — K5669 Other partial intestinal obstruction: Secondary | ICD-10-CM | POA: Diagnosis not present

## 2023-05-22 DIAGNOSIS — D72829 Elevated white blood cell count, unspecified: Secondary | ICD-10-CM | POA: Diagnosis not present

## 2023-05-22 DIAGNOSIS — Z79899 Other long term (current) drug therapy: Secondary | ICD-10-CM | POA: Diagnosis not present

## 2023-05-22 DIAGNOSIS — E119 Type 2 diabetes mellitus without complications: Secondary | ICD-10-CM

## 2023-05-22 DIAGNOSIS — I10 Essential (primary) hypertension: Secondary | ICD-10-CM | POA: Diagnosis present

## 2023-05-22 DIAGNOSIS — E785 Hyperlipidemia, unspecified: Secondary | ICD-10-CM | POA: Diagnosis not present

## 2023-05-22 DIAGNOSIS — E1165 Type 2 diabetes mellitus with hyperglycemia: Secondary | ICD-10-CM | POA: Diagnosis present

## 2023-05-22 DIAGNOSIS — G4733 Obstructive sleep apnea (adult) (pediatric): Secondary | ICD-10-CM | POA: Diagnosis present

## 2023-05-22 DIAGNOSIS — R109 Unspecified abdominal pain: Secondary | ICD-10-CM | POA: Diagnosis not present

## 2023-05-22 DIAGNOSIS — E1169 Type 2 diabetes mellitus with other specified complication: Secondary | ICD-10-CM | POA: Diagnosis not present

## 2023-05-22 DIAGNOSIS — F17220 Nicotine dependence, chewing tobacco, uncomplicated: Secondary | ICD-10-CM | POA: Diagnosis present

## 2023-05-22 DIAGNOSIS — K5730 Diverticulosis of large intestine without perforation or abscess without bleeding: Secondary | ICD-10-CM | POA: Diagnosis not present

## 2023-05-22 DIAGNOSIS — F419 Anxiety disorder, unspecified: Secondary | ICD-10-CM | POA: Diagnosis not present

## 2023-05-22 DIAGNOSIS — Z9049 Acquired absence of other specified parts of digestive tract: Secondary | ICD-10-CM

## 2023-05-22 DIAGNOSIS — K567 Ileus, unspecified: Secondary | ICD-10-CM | POA: Diagnosis not present

## 2023-05-22 DIAGNOSIS — Z7984 Long term (current) use of oral hypoglycemic drugs: Secondary | ICD-10-CM | POA: Diagnosis not present

## 2023-05-22 DIAGNOSIS — Z6841 Body Mass Index (BMI) 40.0 and over, adult: Secondary | ICD-10-CM | POA: Diagnosis not present

## 2023-05-22 LAB — URINALYSIS, ROUTINE W REFLEX MICROSCOPIC
Bacteria, UA: NONE SEEN
Bilirubin Urine: NEGATIVE
Glucose, UA: >=500 mg/dL — AB
Hgb urine dipstick: NEGATIVE
Ketones, ur: 20 mg/dL — AB
Leukocytes,Ua: NEGATIVE
Nitrite: NEGATIVE
Protein, ur: NEGATIVE mg/dL
Specific Gravity, Urine: 1.045 — ABNORMAL HIGH (ref 1.005–1.030)
pH: 6 (ref 5.0–8.0)

## 2023-05-22 LAB — COMPREHENSIVE METABOLIC PANEL
ALT: 29 U/L (ref 0–44)
AST: 15 U/L (ref 15–41)
Albumin: 3.9 g/dL (ref 3.5–5.0)
Alkaline Phosphatase: 61 U/L (ref 38–126)
Anion gap: 10 (ref 5–15)
BUN: 10 mg/dL (ref 6–20)
CO2: 23 mmol/L (ref 22–32)
Calcium: 9 mg/dL (ref 8.9–10.3)
Chloride: 103 mmol/L (ref 98–111)
Creatinine, Ser: 0.58 mg/dL — ABNORMAL LOW (ref 0.61–1.24)
GFR, Estimated: >60 mL/min (ref >60)
Glucose, Bld: 276 mg/dL — ABNORMAL HIGH (ref 70–99)
Potassium: 3.3 mmol/L — ABNORMAL LOW (ref 3.5–5.1)
Sodium: 136 mmol/L (ref 135–145)
Total Bilirubin: 0.6 mg/dL (ref 0.3–1.2)
Total Protein: 7.1 g/dL (ref 6.5–8.1)

## 2023-05-22 LAB — CBC
HCT: 46 % (ref 39.0–52.0)
Hemoglobin: 15.7 g/dL (ref 13.0–17.0)
MCH: 31 pg (ref 26.0–34.0)
MCHC: 34.1 g/dL (ref 30.0–36.0)
MCV: 90.9 fL (ref 80.0–100.0)
Platelets: 249 10*3/uL (ref 150–400)
RBC: 5.06 MIL/uL (ref 4.22–5.81)
RDW: 13.2 % (ref 11.5–15.5)
WBC: 15 10*3/uL — ABNORMAL HIGH (ref 4.0–10.5)
nRBC: 0 % (ref 0.0–0.2)

## 2023-05-22 LAB — LIPASE, BLOOD: Lipase: 23 U/L (ref 11–51)

## 2023-05-22 MED ORDER — SODIUM CHLORIDE 0.9 % IV SOLN: INTRAVENOUS | Status: DC

## 2023-05-22 MED ORDER — ACETAMINOPHEN 650 MG RE SUPP: 650.0000 mg | Freq: Four times a day (QID) | RECTAL | Status: DC | PRN

## 2023-05-22 MED ORDER — ONDANSETRON HCL 4 MG/2ML IJ SOLN: 4.0000 mg | Freq: Four times a day (QID) | INTRAMUSCULAR | Status: DC | PRN

## 2023-05-22 MED ORDER — MORPHINE SULFATE (PF) 4 MG/ML IV SOLN
4.0000 mg | Freq: Once | INTRAVENOUS | Status: AC
  Administered 2023-05-22: 4 mg via INTRAVENOUS
  Filled 2023-05-22: qty 1

## 2023-05-22 MED ORDER — TRAZODONE HCL 50 MG PO TABS: 25.0000 mg | ORAL_TABLET | Freq: Every evening | ORAL | Status: DC | PRN

## 2023-05-22 MED ORDER — ONDANSETRON HCL 4 MG PO TABS: 4.0000 mg | ORAL_TABLET | Freq: Four times a day (QID) | ORAL | Status: DC | PRN

## 2023-05-22 MED ORDER — ACETAMINOPHEN 325 MG PO TABS: 650.0000 mg | ORAL_TABLET | Freq: Four times a day (QID) | ORAL | Status: DC | PRN

## 2023-05-22 MED ORDER — AMLODIPINE BESYLATE 10 MG PO TABS
10.0000 mg | ORAL_TABLET | Freq: Every day | ORAL | Status: DC
  Administered 2023-05-22 – 2023-05-25 (×4): 10 mg via ORAL
  Filled 2023-05-22 (×3): qty 1
  Filled 2023-05-22: qty 2

## 2023-05-22 MED ORDER — IOHEXOL 300 MG/ML  SOLN
100.0000 mL | Freq: Once | INTRAMUSCULAR | Status: AC | PRN
  Administered 2023-05-22: 100 mL via INTRAVENOUS

## 2023-05-22 MED ORDER — HYDROMORPHONE HCL 1 MG/ML IJ SOLN: 0.5000 mg | INTRAMUSCULAR | Status: DC | PRN

## 2023-05-22 MED ORDER — OXYCODONE HCL 5 MG PO TABS: 5.0000 mg | ORAL_TABLET | ORAL | Status: DC | PRN

## 2023-05-22 MED ORDER — POTASSIUM CHLORIDE 10 MEQ/100ML IV SOLN
10.0000 meq | Freq: Once | INTRAVENOUS | Status: AC
  Administered 2023-05-22: 10 meq via INTRAVENOUS
  Filled 2023-05-22: qty 100

## 2023-05-22 MED ORDER — SODIUM CHLORIDE 0.9 % IV BOLUS
1000.0000 mL | Freq: Once | INTRAVENOUS | Status: AC
  Administered 2023-05-22: 1000 mL via INTRAVENOUS

## 2023-05-22 MED ORDER — POTASSIUM CHLORIDE 10 MEQ/100ML IV SOLN
10.0000 meq | Freq: Once | INTRAVENOUS | Status: DC
  Filled 2023-05-22: qty 100

## 2023-05-22 MED ORDER — LORAZEPAM 1 MG PO TABS: 1.0000 mg | ORAL_TABLET | ORAL | Status: DC | PRN

## 2023-05-22 MED ORDER — INSULIN ASPART 100 UNIT/ML IJ SOLN
0.0000 [IU] | INTRAMUSCULAR | Status: DC
  Administered 2023-05-22: 2 [IU] via SUBCUTANEOUS
  Administered 2023-05-22: 3 [IU] via SUBCUTANEOUS
  Administered 2023-05-23 (×3): 2 [IU] via SUBCUTANEOUS
  Administered 2023-05-24: 3 [IU] via SUBCUTANEOUS
  Administered 2023-05-24: 2 [IU] via SUBCUTANEOUS
  Administered 2023-05-24: 3 [IU] via SUBCUTANEOUS
  Administered 2023-05-25: 2 [IU] via SUBCUTANEOUS
  Filled 2023-05-22: qty 0.15

## 2023-05-22 MED ORDER — DIATRIZOATE MEGLUMINE & SODIUM 66-10 % PO SOLN
90.0000 mL | Freq: Once | ORAL | Status: AC
  Administered 2023-05-22: 90 mL via ORAL
  Filled 2023-05-22: qty 90

## 2023-05-22 MED ORDER — LISINOPRIL 10 MG PO TABS
10.0000 mg | ORAL_TABLET | Freq: Every day | ORAL | Status: DC
  Administered 2023-05-22 – 2023-05-25 (×4): 10 mg via ORAL
  Filled 2023-05-22 (×5): qty 1

## 2023-05-22 MED ORDER — ENOXAPARIN SODIUM 40 MG/0.4ML IJ SOSY: 40.0000 mg | PREFILLED_SYRINGE | Freq: Every day | INTRAMUSCULAR | Status: DC

## 2023-05-22 MED ORDER — ENOXAPARIN SODIUM 80 MG/0.8ML IJ SOSY
80.0000 mg | PREFILLED_SYRINGE | Freq: Every day | INTRAMUSCULAR | Status: DC
  Administered 2023-05-22 – 2023-05-25 (×4): 80 mg via SUBCUTANEOUS
  Filled 2023-05-22 (×5): qty 0.8

## 2023-05-22 MED ORDER — ALBUTEROL SULFATE (2.5 MG/3ML) 0.083% IN NEBU: 2.5000 mg | INHALATION_SOLUTION | RESPIRATORY_TRACT | Status: DC | PRN

## 2023-05-22 NOTE — Telephone Encounter (Signed)
Pt called to see if he could schedule an appointment for today.  Pt reports that the feeling he got when he went to the hospital earlier in the week is coming back. Pt states that they wanted to admit him but he stayed in the ED due to bed shortage. Pt reports that provider at ED says his bowels wasn't moving.   Pt is requesting a GI referral to seeing what's causing the issue.   After gathering all information I asked the provider what his recommendation was for the patient since he felt like the feeling was coming back and he requesting that patient return to ED for further Evaluation knowing it maybe a possible admission.  Pt agreed to return to [facility] for Eval

## 2023-05-22 NOTE — H&P (Signed)
History and Physical  Robert Lewis:[id number] DOB: March 01, 1966 DOA: 05/22/2023  PCP: Melida Quitter, PA   Chief Complaint: abd pain   HPI: Robert Lewis is a 57 y.o. male with medical history significant for non-insulin-dependent type 2 diabetes, obesity, hypertension being admitted to the hospital with concern for partial small bowel obstruction.  He went to [facility] on the evening of 8/27 for abdominal pain, was diagnosed with ileus, was given MiraLAX and stool softener had a large bowel movement felt better and left AMA.  He spent all day at home yesterday, was feeling fine until this morning when he had recurrence of abdominal pain.  The pain is epigastric and nonradiating.  No associated fever, cough, shortness of breath, nausea, vomiting.  Had a couple of bowel movements since he left the hospital the other day, but not today.  He has a history of prior appendectomy in 2010, but no other abdominal surgeries.  ED Course: On evaluation here in the ER, he is hypertensive but vital signs are otherwise unremarkable.  Lab work demonstrates WBC 15K, potassium 3.3, normal renal function and LFTs.  CT scan as detailed below shows ileus and possible small bowel obstruction provider discussed with general surgery, who has seen the patient in consultation.  Review of Systems: Please see HPI for pertinent positives and negatives. A complete 10 system review of systems are otherwise negative.  Past Medical History:  Diagnosis Date   Agatston coronary artery calcium score between 200 and 399 05/2019   Controlled diabetes mellitus type II without complication (HCC)    Not on insulin   Hyperlipidemia associated with type 2 diabetes mellitus (HCC)    Hypertension    Morbid obesity with BMI of 45.0-49.9, adult (HCC)    OSA on CPAP    Past Surgical History:  Procedure Laterality Date   APPENDECTOMY     FEMUR FRACTURE SURGERY Left    ORIF with pin in place   IRRIGATION AND  DEBRIDEMENT ABSCESS Right 06/05/2021   Procedure: IRRIGATION AND DEBRIDEMENT ABSCESS;  Surgeon: Heloise Purpura, MD;  Location: [facility];  Service: Urology;  Laterality: Right;   TRANSTHORACIC ECHOCARDIOGRAM  05/2019   EF 60-65%.  Normal wall motion.  Mild LVH.  Normal RV size and function.  Normal atria.  Mild MAC.  Mild aortic sclerosis with no stenosis.--Essentially normal.    Social History:  reports that he has never smoked. He has never been exposed to tobacco smoke. His smokeless tobacco use includes chew. He reports that he does not drink alcohol and does not use drugs.   No Known Allergies  Family History  Problem Relation Age of Onset   Prostate cancer Father    Heart attack Father 56   Pulmonary embolism Father 42       Cause of death   Heart attack Paternal Grandfather        Presumably in his 71s     Prior to Admission medications   Medication Sig Start Date End Date Taking? Authorizing Provider  ACCU-CHEK GUIDE test strip DISPENSE BASED ON PATIENT AND INSURANCE PREFERENCE. USE UP TO FOUR TIMES DAILY AS DIRECTED. 06/10/21   [provider]  Accu-Chek Softclix Lancets lancets See admin instructions. 06/10/21   [provider]  amLODipine (NORVASC) 10 MG tablet Take 1 tablet (10 mg total) by mouth daily. 04/06/23   Melida Quitter, PA  blood glucose meter kit and supplies KIT Dispense based on patient and insurance preference. Use up to  four times daily as directed. 06/09/21   Burnadette Pop, MD  Blood Pressure KIT 1 each by Does not apply route 2 (two) times daily. 07/16/21   Mayer Masker, PA-C  dapagliflozin propanediol (FARXIGA) 10 MG TABS tablet Take 1 tablet (10 mg total) by mouth daily before breakfast. 04/06/23   Saralyn Pilar A, PA  lisinopril (ZESTRIL) 10 MG tablet Take 1 tablet (10 mg total) by mouth daily. 04/06/23   Melida Quitter, PA  metFORMIN (GLUCOPHAGE-XR) 500 MG 24 hr tablet Take 1 tablet (500 mg total) by mouth 2 (two) times daily with a  meal. 04/06/23   Edstrom, Simmie Davies, PA  tirzepatide [facility]) 2.5 MG/0.5ML Pen Inject 2.5 mg into the skin once a week. Patient not taking: Reported on 04/21/2023 04/06/23   Melida Quitter, PA    Physical Exam: BP (!) 157/86   Pulse 98   Temp 98.2 F (36.8 C) (Oral)   Resp 20   Ht 6\' 2"  (1.88 m)   Wt (!) 158.8 kg   SpO2 96%   BMI 44.94 kg/m   General:  Alert, oriented, calm, in no acute distress, sitting up at the edge of the bed, looks very comfortable Eyes: EOMI, clear conjuctivae, white sclerea Neck: supple, no masses, trachea mildline  Cardiovascular: RRR, no murmurs or rubs, no peripheral edema  Respiratory: clear to auscultation bilaterally, no wheezes, no crackles  Abdomen: soft, nontender, nondistended, obese, normal bowel tones heard  Skin: dry, no rashes  Musculoskeletal: no joint effusions, normal range of motion  Psychiatric: appropriate affect, normal speech  Neurologic: extraocular muscles intact, clear speech, moving all extremities with intact sensorium          Labs on Admission:  Basic Metabolic Panel: Recent Labs  Lab 05/22/23 1029  NA 136  K 3.3*  CL 103  CO2 23  GLUCOSE 276*  BUN 10  CREATININE 0.58*  CALCIUM 9.0   Liver Function Tests: Recent Labs  Lab 05/22/23 1029  AST 15  ALT 29  ALKPHOS 61  BILITOT 0.6  PROT 7.1  ALBUMIN 3.9   Recent Labs  Lab 05/22/23 1029  LIPASE 23   No results for input(s): "AMMONIA" in the last 168 hours. CBC: Recent Labs  Lab 05/22/23 1029  WBC 15.0*  HGB 15.7  HCT 46.0  MCV 90.9  PLT 249   Cardiac Enzymes: No results for input(s): "CKTOTAL", "CKMB", "CKMBINDEX", "TROPONINI" in the last 168 hours.  BNP (last 3 results) No results for input(s): "BNP" in the last 8760 hours.  ProBNP (last 3 results) No results for input(s): "PROBNP" in the last 8760 hours.  CBG: No results for input(s): "GLUCAP" in the last 168 hours.  Radiological Exams on Admission: CT ABDOMEN PELVIS W  CONTRAST  Result Date: 05/22/2023 CLINICAL DATA:  Acute generalized abdominal pain for couple of days. EXAM: CT ABDOMEN AND PELVIS WITH CONTRAST TECHNIQUE: Multidetector CT imaging of the abdomen and pelvis was performed using the standard protocol following bolus administration of intravenous contrast. RADIATION DOSE REDUCTION: This exam was performed according to the departmental dose-optimization program which includes automated exposure control, adjustment of the mA and/or kV according to patient size and/or use of iterative reconstruction technique. CONTRAST:  OMNIPAQUE IOHEXOL 300 MG/ML  SOLN COMPARISON:  June 05, 2021.  August 02, 2009. FINDINGS: Lower chest: No acute abnormality. Hepatobiliary: No focal liver abnormality is seen. No gallstones, gallbladder wall thickening, or biliary dilatation. Pancreas: Unremarkable. No pancreatic ductal dilatation or surrounding inflammatory changes. Spleen: Normal  in size without focal abnormality. Adrenals/Urinary Tract: Adrenal glands are unremarkable. Kidneys are normal, without renal calculi, focal lesion, or hydronephrosis. Bladder is unremarkable. Stomach/Bowel: Status post appendectomy. Stomach is unremarkable. Mildly dilated small bowel loops are noted without definite transition zone concerning for ileus or distal small bowel obstruction. No colonic dilatation is noted. Sigmoid diverticulosis without inflammation. Vascular/Lymphatic: No significant vascular findings are present. No enlarged abdominal or pelvic lymph nodes. Reproductive: Prostate is unremarkable. Other: No abdominal wall hernia or abnormality. No abdominopelvic ascites. Musculoskeletal: No acute or significant osseous findings. IMPRESSION: Mildly dilated small bowel loops are noted concerning for ileus or distal small bowel obstruction. Sigmoid diverticulosis without inflammation. Electronically Signed   By: Lupita Raider M.D.   On: 05/22/2023 13:03    Assessment/Plan This is  a pleasant 57 year old gentleman with a history of morbid obesity, hypertension, noninsulin-dependent type 2 diabetes being admitted to the hospital with abdominal pain with imaging concerning for ileus versus distal small bowel obstruction.  Ileus/distal small bowel obstruction-without nausea or vomiting.  Could be due to recent narcotics after left arm surgery. -Inpatient admission -IV fluids -Pain and nausea medication as needed, however use narcotics judiciously Currently no nausea, develops -Appreciate general surgery consultation and management -Keep n.p.o. for now except for ice chips and meds  Type 2 diabetes -Carb controlled diet when eating -Hold metformin while in-house -Sliding scale insulin every 4 hours while NPO  Hypertension -continue home amlodipine and lisinopril  Leukocytosis-likely reactive, was as high as 27K at [facility] as no other signs or symptoms of acute infection  Morbid obesity-BMI 44.94 complicating all aspects of care  DVT prophylaxis: Lovenox     Code Status: Full Code  Consults called: None  Admission status: The appropriate patient status for this patient is INPATIENT. Inpatient status is judged to be reasonable and necessary in order to provide the required intensity of service to ensure the patient's safety. The patient's presenting symptoms, physical exam findings, and initial radiographic and laboratory data in the context of their chronic comorbidities is felt to place them at high risk for further clinical deterioration. Furthermore, it is not anticipated that the patient will be medically stable for discharge from the hospital within 2 midnights of admission.    I certify that at the point of admission it is my clinical judgment that the patient will require inpatient hospital care spanning beyond 2 midnights from the point of admission due to high intensity of service, high risk for further deterioration and high frequency of surveillance  required  Time spent: 46 minutes  Kree Armato Sharlette Dense MD [facility] Pager [phone]  If 7PM-7AM, please contact night-coverage [email or web address] Password [facility]  05/22/2023, 2:33 PM

## 2023-05-22 NOTE — ED Triage Notes (Signed)
Pt had abd pain that began a couple days ago. Was seen at an emergency dept, pt states that he was given mira-lax and had some improvement after having a BM. Pt states that they wanted him to stay for further treatment, but left. Abd pain is increasing again today. Denies any nausea.

## 2023-05-22 NOTE — Consult Note (Signed)
Robert Lewis Jun 04, 1966  [id number].    Requesting MD: Estell Harpin, MD Chief Complaint/Reason for Consult: SBO vs ileus  HPI:  Robert Lewis is a 57 y/o M with a PMH HTN, poorly controlled diabetes (11.2%), and obesity who presents with a cc abdominal pain. He was admitted to [facility] hospital 8/27 with similar pain where he was diagnosed with an ileus and leukocytosis of unclear etiology. Symptoms started after eating a veggie sub, not a restaurant he had been to before. No other family or friends ate there with him and he denied sick contacts. He was given miralax and had a BM, after which time he felt better and left AMA. He returns today due to worsening abdominal pain. He denies aggravating or alleviating factors. He denies fever, chills, nausea, or vomiting. He has had bowel movements since leaving [facility], liquid to soft consistency. He states that he is passing gas. He did just restart Ozempic yesterday but denies hx of issues of constipation with this. He also was taking some oxycodone for a few nights for anxiety after surgery on his LUE but had not taken any prior to initial symptom onset for at least a week.   Blood thinners: none Substance use: uses chewing tobacco, denies smoking cigarettes. Denies alcohol or drug use.  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   ROS: Negative other than HPI  Family History  Problem Relation Age of Onset   Prostate cancer Father    Heart attack Father 52   Pulmonary embolism Father 65       Cause of death   Heart attack Paternal Grandfather        Presumably in his 83s    Past Medical History:  Diagnosis Date   Agatston coronary artery calcium score between 200 and 399 05/2019   Controlled diabetes mellitus type II without complication (HCC)    Not on insulin   Hyperlipidemia associated with type 2 diabetes mellitus (HCC)    Hypertension     Morbid obesity with BMI of 45.0-49.9, adult (HCC)    OSA on CPAP     Past Surgical History:  Procedure Laterality Date   APPENDECTOMY     FEMUR FRACTURE SURGERY Left    ORIF with pin in place   IRRIGATION AND DEBRIDEMENT ABSCESS Right 06/05/2021   Procedure: IRRIGATION AND DEBRIDEMENT ABSCESS;  Surgeon: Heloise Purpura, MD;  Location: [facility];  Service: Urology;  Laterality: Right;   TRANSTHORACIC ECHOCARDIOGRAM  05/2019   EF 60-65%.  Normal wall motion.  Mild LVH.  Normal RV size and function.  Normal atria.  Mild MAC.  Mild aortic sclerosis with no stenosis.--Essentially normal.    Social History:  reports that he has never smoked. He has never been exposed to tobacco smoke. His smokeless tobacco use includes chew. He reports that he does not drink alcohol and does not use drugs.  Allergies: No Known Allergies  (Not in a hospital admission)    Physical Exam: Blood pressure (!) 157/86, pulse 98, temperature 98.2 F (36.8 C), temperature source Oral, resp. rate 20, height 6\' 2"  (1.88 m), weight (!) 158.8 kg, SpO2 96%. General: pleasant, WD, obese male who is laying in bed in NAD HEENT: head is normocephalic, atraumatic.  EOMI, mouth pink and moist Heart: regular, rate, and rhythm.   Lungs: CTAB, no wheezes, rhonchi, or rales noted.  Respiratory effort nonlabored Abd: soft, no TTP or peritonitis, distended, unable  to auscultate BS secondary to habitus, diastasis recti  MS: all 4 extremities are symmetrical with no cyanosis, clubbing, or edema. Skin: warm and dry with no masses, lesions, or rashes Neuro: Cranial nerves 2-12 grossly intact, sensation is normal throughout Psych: A&Ox3 with an appropriate affect.   Results for orders placed or performed during the hospital encounter of 05/22/23 (from the past 48 hour(s))  Lipase, blood     Status: None   Collection Time: 05/22/23 10:29 AM  Result Value Ref Range   Lipase 23 11 - 51 U/L    Comment: Performed at [facility], [address] [place], [place] [address]  Comprehensive metabolic panel     Status: Abnormal   Collection Time: 05/22/23 10:29 AM  Result Value Ref Range   Sodium 136 135 - 145 mmol/L   Potassium 3.3 (L) 3.5 - 5.1 mmol/L   Chloride 103 98 - 111 mmol/L   CO2 23 22 - 32 mmol/L   Glucose, Bld 276 (H) 70 - 99 mg/dL    Comment: Glucose reference range applies only to samples taken after fasting for at least 8 hours.   BUN 10 6 - 20 mg/dL   Creatinine, Ser 6.21 (L) 0.61 - 1.24 mg/dL   Calcium 9.0 8.9 - 30.8 mg/dL   Total Protein 7.1 6.5 - 8.1 g/dL   Albumin 3.9 3.5 - 5.0 g/dL   AST 15 15 - 41 U/L   ALT 29 0 - 44 U/L   Alkaline Phosphatase 61 38 - 126 U/L   Total Bilirubin 0.6 0.3 - 1.2 mg/dL   GFR, Estimated >65 >78 mL/min    Comment: (NOTE) Calculated using the CKD-EPI Creatinine Equation (2021)    Anion gap 10 5 - 15    Comment: Performed at [facility], [address] [place], [place] [address]  CBC     Status: Abnormal   Collection Time: 05/22/23 10:29 AM  Result Value Ref Range   WBC 15.0 (H) 4.0 - 10.5 K/uL   RBC 5.06 4.22 - 5.81 MIL/uL   Hemoglobin 15.7 13.0 - 17.0 g/dL   HCT 95.2 84.1 - 32.4 %   MCV 90.9 80.0 - 100.0 fL   MCH 31.0 26.0 - 34.0 pg   MCHC 34.1 30.0 - 36.0 g/dL   RDW 40.1 02.7 - 25.3 %   Platelets 249 150 - 400 K/uL   nRBC 0.0 0.0 - 0.2 %    Comment: Performed at [facility], [address], [place], [place] [address]  Urinalysis, Routine w reflex microscopic -Urine, Clean Catch     Status: Abnormal   Collection Time: 05/22/23 10:29 AM  Result Value Ref Range   Color, Urine YELLOW YELLOW   APPearance CLEAR CLEAR   Specific Gravity, Urine 1.045 (H) 1.005 - 1.030   pH 6.0 5.0 - 8.0   Glucose, UA >=500 (A) NEGATIVE mg/dL   Hgb urine dipstick NEGATIVE NEGATIVE   Bilirubin Urine NEGATIVE NEGATIVE   Ketones, ur 20 (A) NEGATIVE mg/dL   Protein, ur NEGATIVE NEGATIVE mg/dL   Nitrite NEGATIVE  NEGATIVE   Leukocytes,Ua NEGATIVE NEGATIVE   RBC / HPF 0-5 0 - 5 RBC/hpf   WBC, UA 0-5 0 - 5 WBC/hpf   Bacteria, UA NONE SEEN NONE SEEN   Squamous Epithelial / HPF 0-5 0 - 5 /HPF    Comment: Performed at [facility], [address] [place], [place] [address]   CT ABDOMEN PELVIS W  CONTRAST  Result Date: 05/22/2023 CLINICAL DATA:  Acute generalized abdominal pain for couple of days. EXAM: CT ABDOMEN AND PELVIS WITH CONTRAST TECHNIQUE: Multidetector CT imaging of the abdomen and pelvis was performed using the standard protocol following bolus administration of intravenous contrast. RADIATION DOSE REDUCTION: This exam was performed according to the departmental dose-optimization program which includes automated exposure control, adjustment of the mA and/or kV according to patient size and/or use of iterative reconstruction technique. CONTRAST:  OMNIPAQUE IOHEXOL 300 MG/ML  SOLN COMPARISON:  June 05, 2021.  August 02, 2009. FINDINGS: Lower chest: No acute abnormality. Hepatobiliary: No focal liver abnormality is seen. No gallstones, gallbladder wall thickening, or biliary dilatation. Pancreas: Unremarkable. No pancreatic ductal dilatation or surrounding inflammatory changes. Spleen: Normal in size without focal abnormality. Adrenals/Urinary Tract: Adrenal glands are unremarkable. Kidneys are normal, without renal calculi, focal lesion, or hydronephrosis. Bladder is unremarkable. Stomach/Bowel: Status post appendectomy. Stomach is unremarkable. Mildly dilated small bowel loops are noted without definite transition zone concerning for ileus or distal small bowel obstruction. No colonic dilatation is noted. Sigmoid diverticulosis without inflammation. Vascular/Lymphatic: No significant vascular findings are present. No enlarged abdominal or pelvic lymph nodes. Reproductive: Prostate is unremarkable. Other: No abdominal wall hernia or abnormality. No abdominopelvic ascites.  Musculoskeletal: No acute or significant osseous findings. IMPRESSION: Mildly dilated small bowel loops are noted concerning for ileus or distal small bowel obstruction. Sigmoid diverticulosis without inflammation. Electronically Signed   By: Lupita Raider M.D.   On: 05/22/2023 13:03      Assessment/Plan Ileus vs pSBO - afebrile, VSS, WBC 15 (per documentation in care everywhere was 20 and 27 at [facility] [place])  - PO SBO protocol - if he develops n/v then would recommend NGT placement but do not feel this is needed at present time  - no emergent surgical needs but we will follow  - recommend medical admission - keep K >4.0 and Mg >2.0  - mobilize - ok to have small amount of ice chips only    FEN - NPO, IVF VTE - SCD's, lovenox ID - none indicated from a surgical perspective; no signs of intra-abdominal infection, UA negative Admit - [facility] service  HTN DM2, poorly controlled Obestiy - on Ozempic    I reviewed ED provider notes, last 24 h vitals and pain scores, last 48 h intake and output, last 24 h labs and trends, last 24 h imaging results, and records from [facility] .  Juliet Rude, [facility] 05/22/2023, 2:30 PM Please see Amion for pager number during day hours 7:00am-4:30pm

## 2023-05-22 NOTE — ED Notes (Signed)
Patient insisted on going to his car. Staff tried to persuade him to stay, but he continued to insist. Med delay.

## 2023-05-22 NOTE — ED Provider Notes (Signed)
[place] EMERGENCY DEPARTMENT AT [facility] Provider Note   CSN: [id number] Arrival date & time: 05/22/23  0945     History  Chief Complaint  Patient presents with   Abdominal Pain    Robert Lewis is a 57 y.o. male.  Patient with history of hypertension, hyperlipidemia, diabetes, morbid obesity presents today with complaints of abdominal pain. States that same began suddenly on 8/27, was seen for same and [facility] where he had a CT scan that showed an ileus. Appears it was thought to be due to Ozempic vs oxycodone both of which he had newly taken. He also had leukocytosis at 27.0 and was admitted for management of both of these. Seems they were uncertain of etiology of leukocytosis. However, he was given Miralax and Ducolax and had a bowel movement and resolution of his pain and then proceeded to leave AMA given his pain had resolved. He never had any nausea or vomiting. He states that today his pain is back today and similar to previous. He has not had a bowel movement since the one he had after the Miralax and Ducolax. He is passing flatus. Does note remote history of appendectomy and hernia repair. Currently denies nausea, vomiting, fevers or chills. No history of similar symptoms previously. Pain is periumbilical.  The history is provided by the patient. No language interpreter was used.  Abdominal Pain      Home Medications Prior to Admission medications   Medication Sig Start Date End Date Taking? Authorizing Provider  ACCU-CHEK GUIDE test strip DISPENSE BASED ON PATIENT AND INSURANCE PREFERENCE. USE UP TO FOUR TIMES DAILY AS DIRECTED. 06/10/21   [provider]  Accu-Chek Softclix Lancets lancets See admin instructions. 06/10/21   [provider]  amLODipine (NORVASC) 10 MG tablet Take 1 tablet (10 mg total) by mouth daily. 04/06/23   Melida Quitter, PA  blood glucose meter kit and supplies KIT Dispense based on patient and  insurance preference. Use up to four times daily as directed. 06/09/21   Burnadette Pop, MD  Blood Pressure KIT 1 each by Does not apply route 2 (two) times daily. 07/16/21   Mayer Masker, PA-C  dapagliflozin propanediol (FARXIGA) 10 MG TABS tablet Take 1 tablet (10 mg total) by mouth daily before breakfast. 04/06/23   Saralyn Pilar A, PA  lisinopril (ZESTRIL) 10 MG tablet Take 1 tablet (10 mg total) by mouth daily. 04/06/23   Melida Quitter, PA  metFORMIN (GLUCOPHAGE-XR) 500 MG 24 hr tablet Take 1 tablet (500 mg total) by mouth 2 (two) times daily with a meal. 04/06/23   Edstrom, Simmie Davies, PA  tirzepatide [facility]) 2.5 MG/0.5ML Pen Inject 2.5 mg into the skin once a week. Patient not taking: Reported on 04/21/2023 04/06/23   Melida Quitter, PA      Allergies    Patient has no known allergies.    Review of Systems   Review of Systems  Gastrointestinal:  Positive for abdominal pain.  All other systems reviewed and are negative.   Physical Exam Updated Vital Signs BP (!) 157/86   Pulse 98   Temp 98.2 F (36.8 C) (Oral)   Resp 20   Ht 6\' 2"  (1.88 m)   Wt (!) 158.8 kg   SpO2 96%   BMI 44.94 kg/m  Physical Exam Vitals and nursing note reviewed.  Constitutional:      General: He is not in acute distress.    Appearance: Normal appearance. He is  normal weight. He is not ill-appearing, toxic-appearing or diaphoretic.  HENT:     Head: Normocephalic and atraumatic.  Cardiovascular:     Rate and Rhythm: Normal rate.  Pulmonary:     Effort: Pulmonary effort is normal. No respiratory distress.  Abdominal:     General: There is distension.     Palpations: Abdomen is soft.     Tenderness: There is abdominal tenderness in the periumbilical area.  Musculoskeletal:        General: Normal range of motion.     Cervical back: Normal range of motion.  Skin:    General: Skin is warm and dry.  Neurological:     General: No focal deficit present.     Mental Status: He is alert.   Psychiatric:        Mood and Affect: Mood normal.        Behavior: Behavior normal.     ED Results / Procedures / Treatments   Labs (all labs ordered are listed, but only abnormal results are displayed) Labs Reviewed  COMPREHENSIVE METABOLIC PANEL - Abnormal; Notable for the following components:      Result Value   Potassium 3.3 (*)    Glucose, Bld 276 (*)    Creatinine, Ser 0.58 (*)    All other components within normal limits  CBC - Abnormal; Notable for the following components:   WBC 15.0 (*)    All other components within normal limits  URINALYSIS, ROUTINE W REFLEX MICROSCOPIC - Abnormal; Notable for the following components:   Specific Gravity, Urine 1.045 (*)    Glucose, UA >=500 (*)    Ketones, ur 20 (*)    All other components within normal limits  LIPASE, BLOOD    EKG None  Radiology CT ABDOMEN PELVIS W CONTRAST  Result Date: 05/22/2023 CLINICAL DATA:  Acute generalized abdominal pain for couple of days. EXAM: CT ABDOMEN AND PELVIS WITH CONTRAST TECHNIQUE: Multidetector CT imaging of the abdomen and pelvis was performed using the standard protocol following bolus administration of intravenous contrast. RADIATION DOSE REDUCTION: This exam was performed according to the departmental dose-optimization program which includes automated exposure control, adjustment of the mA and/or kV according to patient size and/or use of iterative reconstruction technique. CONTRAST:  OMNIPAQUE IOHEXOL 300 MG/ML  SOLN COMPARISON:  June 05, 2021.  August 02, 2009. FINDINGS: Lower chest: No acute abnormality. Hepatobiliary: No focal liver abnormality is seen. No gallstones, gallbladder wall thickening, or biliary dilatation. Pancreas: Unremarkable. No pancreatic ductal dilatation or surrounding inflammatory changes. Spleen: Normal in size without focal abnormality. Adrenals/Urinary Tract: Adrenal glands are unremarkable. Kidneys are normal, without renal calculi, focal lesion, or  hydronephrosis. Bladder is unremarkable. Stomach/Bowel: Status post appendectomy. Stomach is unremarkable. Mildly dilated small bowel loops are noted without definite transition zone concerning for ileus or distal small bowel obstruction. No colonic dilatation is noted. Sigmoid diverticulosis without inflammation. Vascular/Lymphatic: No significant vascular findings are present. No enlarged abdominal or pelvic lymph nodes. Reproductive: Prostate is unremarkable. Other: No abdominal wall hernia or abnormality. No abdominopelvic ascites. Musculoskeletal: No acute or significant osseous findings. IMPRESSION: Mildly dilated small bowel loops are noted concerning for ileus or distal small bowel obstruction. Sigmoid diverticulosis without inflammation. Electronically Signed   By: Lupita Raider M.D.   On: 05/22/2023 13:03    Procedures Procedures    Medications Ordered in ED Medications  potassium chloride 10 mEq in 100 mL IVPB (has no administration in time range)  sodium chloride 0.9 % bolus  1,000 mL (1,000 mLs Intravenous New Bag/Given 05/22/23 1206)  morphine (PF) 4 MG/ML injection 4 mg (4 mg Intravenous Given 05/22/23 1207)  iohexol (OMNIPAQUE) 300 MG/ML solution 100 mL (100 mLs Intravenous Contrast Given 05/22/23 1225)    ED Course/ Medical Decision Making/ A&P                                 Medical Decision Making Amount and/or Complexity of Data Reviewed Labs: ordered. Radiology: ordered.  Risk Prescription drug management.   This patient is a 57 y.o. male who presents to the ED for concern of abdominal pain, this involves an extensive number of treatment options, and is a complaint that carries with it a high risk of complications and morbidity. The emergent differential diagnosis prior to evaluation includes, but is not limited to,  The differential diagnosis for generalized abdominal pain includes, but is not limited to AAA, gastroenteritis, appendicitis, Bowel obstruction, Bowel  perforation. Gastroparesis, DKA, Hernia, Inflammatory bowel disease, mesenteric ischemia, pancreatitis, peritonitis SBP, volvulus.   This is not an exhaustive differential.   Past Medical History / Co-morbidities / Social History:  has a past medical history of Agatston coronary artery calcium score between 200 and 399 (05/2019), Controlled diabetes mellitus type II without complication (HCC), Hyperlipidemia associated with type 2 diabetes mellitus (HCC), Hypertension, Morbid obesity with BMI of 45.0-49.9, adult (HCC), and OSA on CPAP.  SDOH Screenings   Housing: Low Risk  (08/21/2022)  Transportation Needs: No Transportation Needs (08/21/2022)  Depression (PHQ2-9): Medium Risk (04/21/2023)  Tobacco Use: High Risk (05/22/2023)   Additional history: Chart reviewed. Pertinent results include: patient seen at [facility] on 8/27, had CT that showed ileus, no obstruction. Leukocytosis was noted at 27, they were unclear of etiology. Patient was recommended for admission, however he decided to leave AMA given he took MiraLAX and Dulcolax and had a bowel movement and his pain resolved.  Physical Exam: Physical exam performed. The pertinent findings include: periumbilical abdominal TTP with some distention noted. No rebound or guarding  Lab Tests: I ordered, and personally interpreted labs.  The pertinent results include:  WBC 15 down from 27 at [facility] 2 days ago. K 3.3, glucose 276. UA with ketones   Imaging Studies: I ordered imaging studies including Ct abdomen pelvis. I independently visualized and interpreted imaging which showed   Mildly dilated small bowel loops are noted concerning for ileus or distal small bowel obstruction.   Sigmoid diverticulosis without inflammation.  I agree with the radiologist interpretation.   Medications: I ordered medication including morphine for pain, fluids for dehydration, IV potassium for hypokalemia. Reevaluation of the patient after these medicines  showed that the patient improved. I have reviewed the patients home medicines and have made adjustments as needed.  Consultations Obtained: I requested consultation with the general surgery on call PA-C Trixie Deis,  and discussed lab and imaging findings as well as pertinent plan - they recommend: admit to medicine, they will see the patient in consultation   Disposition: After consideration of the diagnostic results and the patients response to treatment, I feel that patient will require admission for management of ileus and possible small bowel obstruction. Discussed this with patient who is understanding and in agreement with this.   I discussed this case with my attending physician Dr. Estell Harpin who cosigned this note including patient's presenting symptoms, physical exam, and planned diagnostics and interventions. Attending physician stated agreement with plan  or made changes to plan which were implemented.    Final Clinical Impression(s) / ED Diagnoses Final diagnoses:  Small bowel obstruction (HCC)  Ileus [facility])    Rx / DC Orders ED Discharge Orders     None         Vear Clock 05/22/23 1429    Bethann Berkshire, MD 05/23/23 [phone]

## 2023-05-23 ENCOUNTER — Inpatient Hospital Stay (HOSPITAL_COMMUNITY): Payer: BC Managed Care – PPO

## 2023-05-23 DIAGNOSIS — K56609 Unspecified intestinal obstruction, unspecified as to partial versus complete obstruction: Secondary | ICD-10-CM | POA: Diagnosis not present

## 2023-05-23 LAB — BASIC METABOLIC PANEL
Anion gap: 10 (ref 5–15)
BUN: 12 mg/dL (ref 6–20)
CO2: 23 mmol/L (ref 22–32)
Calcium: 8.4 mg/dL — ABNORMAL LOW (ref 8.9–10.3)
Chloride: 106 mmol/L (ref 98–111)
Creatinine, Ser: 0.61 mg/dL (ref 0.61–1.24)
GFR, Estimated: >60 mL/min (ref >60)
Glucose, Bld: 137 mg/dL — ABNORMAL HIGH (ref 70–99)
Potassium: 3 mmol/L — ABNORMAL LOW (ref 3.5–5.1)
Sodium: 139 mmol/L (ref 135–145)

## 2023-05-23 LAB — CBC
HCT: 42.8 % (ref 39.0–52.0)
Hemoglobin: 14 g/dL (ref 13.0–17.0)
MCH: 30.4 pg (ref 26.0–34.0)
MCHC: 32.7 g/dL (ref 30.0–36.0)
MCV: 92.8 fL (ref 80.0–100.0)
Platelets: 234 10*3/uL (ref 150–400)
RBC: 4.61 MIL/uL (ref 4.22–5.81)
RDW: 13.3 % (ref 11.5–15.5)
WBC: 10.5 10*3/uL (ref 4.0–10.5)
nRBC: 0 % (ref 0.0–0.2)

## 2023-05-23 LAB — HIV ANTIBODY (ROUTINE TESTING W REFLEX): HIV Screen 4th Generation wRfx: NONREACTIVE

## 2023-05-23 NOTE — Hospital Course (Signed)
Robert Lewis is a 57 y.o. male with a history of diabetes mellitus type 2, morbid obesity, hypertension.  Patient presented secondary to abdominal pain with evidence of possible partial small bowel obstruction versus ileus.  General surgery consulted.  Bowel rest initiated.

## 2023-05-23 NOTE — Progress Notes (Signed)
PROGRESS NOTE    JOE FAZZINA  WUJ:[id number] DOB: [birth year] DOA: 05/22/2023 PCP: Melida Quitter, PA   Brief Narrative: Robert Lewis is a 57 y.o. male with a history of diabetes mellitus type 2, morbid obesity, hypertension.  Patient presented secondary to abdominal pain with evidence of possible partial small bowel obstruction versus ileus.  General surgery consulted.  Bowel rest initiated.   Assessment and Plan:  Ileus versus partial small bowel obstruction General Surgery consulted.  Patient managed conservatively with bowel rest and IV fluids.  SBO protocol started.  Patient with no bowel movements yet. -General Surgery recommendations: Repeat KUB today, clear liquid diet  Diabetes mellitus type 2 Patient is on metformin and Ozempic as an outpatient.  Diabetes is poorly controlled with hyperglycemia as indicated by hemoglobin A1c of 11.2%.  Patient started on sliding scale insulin for inpatient management. -Continue sliding scale insulin  Primary hypertension Patient is on amlodipine and lisinopril as an outpatient which were restarted on admission. -Continue amlodipine and lisinopril  Leukocytosis Likely reactive.  No evidence of acute infection at this time.  Leukocytosis is mild at 15,000 and has resolved.  Morbid obesity Estimated body mass index is 44.94 kg/m as calculated from the following:   Height as of this encounter: 6\' 2"  (1.88 m).   Weight as of this encounter: 158.8 kg.   DVT prophylaxis: Lovenox Code Status:   Code Status: Full Code Family Communication: None at bedside Disposition Plan: Discharge home likely in 1 to 2 days pending ability to advance diet and pending general surgery recommendations for discharge.   Consultants:  General surgery  Procedures:  None  Antimicrobials: None   Subjective: Patient reports improvement in symptoms.  Passing gas.  Abdominal pain is improved.  No bowel movement yet.  Objective: BP  123/62 (BP Location: Left Arm)   Pulse 68   Temp 97.7 F (36.5 C) (Oral)   Resp 16   Ht 6\' 2"  (1.88 m)   Wt (!) 158.8 kg   SpO2 97%   BMI 44.94 kg/m   Examination:  General exam: Appears calm and comfortable Respiratory system: Clear to auscultation. Respiratory effort normal. Cardiovascular system: S1 & S2 heard, RRR. Gastrointestinal system: Abdomen is obese/distended, soft and nontender. Normal bowel sounds heard. Central nervous system: Alert and oriented. No focal neurological deficits. Musculoskeletal: No edema. No calf tenderness Psychiatry: Judgement and insight appear normal. Mood & affect appropriate.    Data Reviewed: I have personally reviewed following labs and imaging studies  CBC Lab Results  Component Value Date   WBC 10.5 05/23/2023   RBC 4.61 05/23/2023   HGB 14.0 05/23/2023   HCT 42.8 05/23/2023   MCV 92.8 05/23/2023   MCH 30.4 05/23/2023   PLT 234 05/23/2023   MCHC 32.7 05/23/2023   RDW 13.3 05/23/2023   LYMPHSABS 2.4 12/02/2022   MONOABS 0.6 06/06/2021   EOSABS 0.1 12/02/2022   BASOSABS 0.1 12/02/2022     Last metabolic panel Lab Results  Component Value Date   NA 139 05/23/2023   K 3.0 (L) 05/23/2023   CL 106 05/23/2023   CO2 23 05/23/2023   BUN 12 05/23/2023   CREATININE 0.61 05/23/2023   GLUCOSE 137 (H) 05/23/2023   GFRNONAA >60 05/23/2023   GFRAA >60 06/04/2019   CALCIUM 8.4 (L) 05/23/2023   PHOS 4.2 06/06/2021   PROT 7.1 05/22/2023   ALBUMIN 3.9 05/22/2023   LABGLOB 2.4 12/02/2022   AGRATIO 1.8 12/02/2022   BILITOT 0.6 05/22/2023  ALKPHOS 61 05/22/2023   AST 15 05/22/2023   ALT 29 05/22/2023   ANIONGAP 10 05/23/2023    GFR: Estimated Creatinine Clearance: 166.5 mL/min (by C-G formula based on SCr of 0.61 mg/dL).  No results found for this or any previous visit (from the past 240 hour(s)).    Radiology Studies: DG Abd Portable 1V-Small Bowel Obstruction Protocol-initial, 8 hr delay  Result Date: 05/23/2023 CLINICAL  DATA:  Small bowel obstruction EXAM: PORTABLE ABDOMEN - 1 VIEW COMPARISON:  CT 05/22/2023 FINDINGS: Dilated left abdominal small bowel loops again noted as seen on prior CT. Contrast material seen within the stomach. Contrast seen within the bladder from earlier CT. No free air or organomegaly. IMPRESSION: Dilated left abdominal small bowel loops compatible with small bowel obstruction. Electronically Signed   By: Charlett Nose M.D.   On: 05/23/2023 01:54   CT ABDOMEN PELVIS W CONTRAST  Result Date: 05/22/2023 CLINICAL DATA:  Acute generalized abdominal pain for couple of days. EXAM: CT ABDOMEN AND PELVIS WITH CONTRAST TECHNIQUE: Multidetector CT imaging of the abdomen and pelvis was performed using the standard protocol following bolus administration of intravenous contrast. RADIATION DOSE REDUCTION: This exam was performed according to the departmental dose-optimization program which includes automated exposure control, adjustment of the mA and/or kV according to patient size and/or use of iterative reconstruction technique. CONTRAST:  OMNIPAQUE IOHEXOL 300 MG/ML  SOLN COMPARISON:  June 05, 2021.  August 02, 2009. FINDINGS: Lower chest: No acute abnormality. Hepatobiliary: No focal liver abnormality is seen. No gallstones, gallbladder wall thickening, or biliary dilatation. Pancreas: Unremarkable. No pancreatic ductal dilatation or surrounding inflammatory changes. Spleen: Normal in size without focal abnormality. Adrenals/Urinary Tract: Adrenal glands are unremarkable. Kidneys are normal, without renal calculi, focal lesion, or hydronephrosis. Bladder is unremarkable. Stomach/Bowel: Status post appendectomy. Stomach is unremarkable. Mildly dilated small bowel loops are noted without definite transition zone concerning for ileus or distal small bowel obstruction. No colonic dilatation is noted. Sigmoid diverticulosis without inflammation. Vascular/Lymphatic: No significant vascular findings are  present. No enlarged abdominal or pelvic lymph nodes. Reproductive: Prostate is unremarkable. Other: No abdominal wall hernia or abnormality. No abdominopelvic ascites. Musculoskeletal: No acute or significant osseous findings. IMPRESSION: Mildly dilated small bowel loops are noted concerning for ileus or distal small bowel obstruction. Sigmoid diverticulosis without inflammation. Electronically Signed   By: Lupita Raider M.D.   On: 05/22/2023 13:03      LOS: 1 day    Jacquelin Hawking, MD [facility] 05/23/2023, 9:03 AM   If 7PM-7AM, please contact night-coverage [email or web address]

## 2023-05-23 NOTE — Progress Notes (Signed)
Subjective/Chief Complaint: Patient denies abdominal pain.  No nausea or vomiting.  He is passing flatus.   Objective: Vital signs in last 24 hours: Temp:  [97.7 F (36.5 C)-98.6 F (37 C)] 97.7 F (36.5 C) (08/31 0524) Pulse Rate:  [68-104] 68 (08/31 0524) Resp:  [10-20] 16 (08/31 0524) BP: (123-186)/(62-91) 123/62 (08/31 0524) SpO2:  [92 %-100 %] 97 % (08/31 0524) Weight:  [158.8 kg] 158.8 kg (08/30 1013) Last BM Date : 05/21/23  Intake/Output from previous day: 08/30 0701 - 08/31 0700 In: 308.4 [P.O.:90; I.V.:180; IV Piggyback:38.4] Out: 0  Intake/Output this shift: Total I/O In: 30 [P.O.:30] Out: -   GI: Soft nontender without rebound or guarding.  Nondistended  Lab Results:  Recent Labs    05/22/23 1029 05/23/23 0540  WBC 15.0* 10.5  HGB 15.7 14.0  HCT 46.0 42.8  PLT 249 234   BMET Recent Labs    05/22/23 1029 05/23/23 0540  NA 136 139  K 3.3* 3.0*  CL 103 106  CO2 23 23  GLUCOSE 276* 137*  BUN 10 12  CREATININE 0.58* 0.61  CALCIUM 9.0 8.4*   PT/INR No results for input(s): "LABPROT", "INR" in the last 72 hours. ABG No results for input(s): "PHART", "HCO3" in the last 72 hours.  Invalid input(s): "PCO2", "PO2"  Studies/Results: DG Abd Portable 1V-Small Bowel Obstruction Protocol-initial, 8 hr delay  Result Date: 05/23/2023 CLINICAL DATA:  Small bowel obstruction EXAM: PORTABLE ABDOMEN - 1 VIEW COMPARISON:  CT 05/22/2023 FINDINGS: Dilated left abdominal small bowel loops again noted as seen on prior CT. Contrast material seen within the stomach. Contrast seen within the bladder from earlier CT. No free air or organomegaly. IMPRESSION: Dilated left abdominal small bowel loops compatible with small bowel obstruction. Electronically Signed   By: Charlett Nose M.D.   On: 05/23/2023 01:54   CT ABDOMEN PELVIS W CONTRAST  Result Date: 05/22/2023 CLINICAL DATA:  Acute generalized abdominal pain for couple of days. EXAM: CT ABDOMEN AND PELVIS WITH  CONTRAST TECHNIQUE: Multidetector CT imaging of the abdomen and pelvis was performed using the standard protocol following bolus administration of intravenous contrast. RADIATION DOSE REDUCTION: This exam was performed according to the departmental dose-optimization program which includes automated exposure control, adjustment of the mA and/or kV according to patient size and/or use of iterative reconstruction technique. CONTRAST:  OMNIPAQUE IOHEXOL 300 MG/ML  SOLN COMPARISON:  June 05, 2021.  August 02, 2009. FINDINGS: Lower chest: No acute abnormality. Hepatobiliary: No focal liver abnormality is seen. No gallstones, gallbladder wall thickening, or biliary dilatation. Pancreas: Unremarkable. No pancreatic ductal dilatation or surrounding inflammatory changes. Spleen: Normal in size without focal abnormality. Adrenals/Urinary Tract: Adrenal glands are unremarkable. Kidneys are normal, without renal calculi, focal lesion, or hydronephrosis. Bladder is unremarkable. Stomach/Bowel: Status post appendectomy. Stomach is unremarkable. Mildly dilated small bowel loops are noted without definite transition zone concerning for ileus or distal small bowel obstruction. No colonic dilatation is noted. Sigmoid diverticulosis without inflammation. Vascular/Lymphatic: No significant vascular findings are present. No enlarged abdominal or pelvic lymph nodes. Reproductive: Prostate is unremarkable. Other: No abdominal wall hernia or abnormality. No abdominopelvic ascites. Musculoskeletal: No acute or significant osseous findings. IMPRESSION: Mildly dilated small bowel loops are noted concerning for ileus or distal small bowel obstruction. Sigmoid diverticulosis without inflammation. Electronically Signed   By: Lupita Raider M.D.   On: 05/22/2023 13:03    Anti-infectives: Anti-infectives (From admission, onward)    None       Assessment/Plan:  leus vs pSBO    Improved  Start clear liquids  Check KUB  today  Repeat films are limited in scope therefore difficult to ascertain bowel gas pattern after review   LOS: 1 day    Maisie Fus A Maedell Hedger 05/23/2023 Moderate

## 2023-05-24 DIAGNOSIS — K56609 Unspecified intestinal obstruction, unspecified as to partial versus complete obstruction: Secondary | ICD-10-CM | POA: Diagnosis not present

## 2023-05-24 LAB — BASIC METABOLIC PANEL
Anion gap: 8 (ref 5–15)
BUN: 9 mg/dL (ref 6–20)
CO2: 26 mmol/L (ref 22–32)
Calcium: 8.2 mg/dL — ABNORMAL LOW (ref 8.9–10.3)
Chloride: 103 mmol/L (ref 98–111)
Creatinine, Ser: 0.7 mg/dL (ref 0.61–1.24)
GFR, Estimated: >60 mL/min (ref >60)
Glucose, Bld: 182 mg/dL — ABNORMAL HIGH (ref 70–99)
Potassium: 3.7 mmol/L (ref 3.5–5.1)
Sodium: 137 mmol/L (ref 135–145)

## 2023-05-24 LAB — GLUCOSE, CAPILLARY
Glucose-Capillary: 107 mg/dL — ABNORMAL HIGH (ref 70–99)
Glucose-Capillary: 132 mg/dL — ABNORMAL HIGH (ref 70–99)
Glucose-Capillary: 156 mg/dL — ABNORMAL HIGH (ref 70–99)

## 2023-05-24 LAB — MAGNESIUM: Magnesium: 2.1 mg/dL (ref 1.7–2.4)

## 2023-05-24 MED ORDER — POTASSIUM CHLORIDE 20 MEQ PO PACK
40.0000 meq | PACK | Freq: Two times a day (BID) | ORAL | Status: AC
  Administered 2023-05-24 (×2): 40 meq via ORAL
  Filled 2023-05-24 (×2): qty 2

## 2023-05-24 NOTE — Progress Notes (Signed)
PROGRESS NOTE    Robert Lewis  NWG:[id number] DOB: 04-12-1966 DOA: 05/22/2023 PCP: Melida Quitter, PA   Brief Narrative: Robert Lewis is a 57 y.o. male with a history of diabetes mellitus type 2, morbid obesity, hypertension.  Patient presented secondary to abdominal pain with evidence of possible partial small bowel obstruction versus ileus.  General surgery consulted.  Bowel rest initiated.   Assessment and Plan:  Ileus versus partial small bowel obstruction General Surgery consulted.  Patient managed conservatively with bowel rest and IV fluids.  SBO protocol started.  Patient with one bowel movement. Repeat abdominal x-rays show contrast into the colon. -General Surgery recommendations: Full liquid diet  Diabetes mellitus type 2 Patient is on metformin and Ozempic as an outpatient.  Diabetes is poorly controlled with hyperglycemia as indicated by hemoglobin A1c of 11.2%.  Patient started on sliding scale insulin for inpatient management. -Continue sliding scale insulin  Primary hypertension Patient is on amlodipine and lisinopril as an outpatient which were restarted on admission. -Continue amlodipine and lisinopril  Leukocytosis Likely reactive.  No evidence of acute infection at this time.  Leukocytosis is mild at 15,000 and has resolved.  Morbid obesity Estimated body mass index is 44.94 kg/m as calculated from the following:   Height as of this encounter: 6\' 2"  (1.88 m).   Weight as of this encounter: 158.8 kg.   DVT prophylaxis: Lovenox Code Status:   Code Status: Full Code Family Communication: None at bedside Disposition Plan: Discharge home likely in 1 to 2 days pending ability to advance diet and pending general surgery recommendations for discharge.   Consultants:  General surgery  Procedures:  None  Antimicrobials: None   Subjective: Patient reports passing gas and having a bowel movement yesterday. He reports walking frequently to  help.   Objective: BP (!) 142/83 (BP Location: Right Arm)   Pulse 75   Temp 98.6 F (37 C)   Resp 14   Ht 6\' 2"  (1.88 m)   Wt (!) 158.8 kg   SpO2 96%   BMI 44.94 kg/m   Examination:  General exam: Appears calm and comfortable Respiratory system: Respiratory effort normal. Gastrointestinal system: Abdomen is distended. Central nervous system: Alert and oriented. Psychiatry: Judgement and insight appear normal. Mood & affect appropriate.    Data Reviewed: I have personally reviewed following labs and imaging studies  CBC Lab Results  Component Value Date   WBC 10.5 05/23/2023   RBC 4.61 05/23/2023   HGB 14.0 05/23/2023   HCT 42.8 05/23/2023   MCV 92.8 05/23/2023   MCH 30.4 05/23/2023   PLT 234 05/23/2023   MCHC 32.7 05/23/2023   RDW 13.3 05/23/2023   LYMPHSABS 2.4 12/02/2022   MONOABS 0.6 06/06/2021   EOSABS 0.1 12/02/2022   BASOSABS 0.1 12/02/2022     Last metabolic panel Lab Results  Component Value Date   NA 137 05/24/2023   K 3.7 05/24/2023   CL 103 05/24/2023   CO2 26 05/24/2023   BUN 9 05/24/2023   CREATININE 0.70 05/24/2023   GLUCOSE 182 (H) 05/24/2023   GFRNONAA >60 05/24/2023   GFRAA >60 06/04/2019   CALCIUM 8.2 (L) 05/24/2023   PHOS 4.2 06/06/2021   PROT 7.1 05/22/2023   ALBUMIN 3.9 05/22/2023   LABGLOB 2.4 12/02/2022   AGRATIO 1.8 12/02/2022   BILITOT 0.6 05/22/2023   ALKPHOS 61 05/22/2023   AST 15 05/22/2023   ALT 29 05/22/2023   ANIONGAP 8 05/24/2023    GFR: Estimated  Creatinine Clearance: 166.5 mL/min (by C-G formula based on SCr of 0.7 mg/dL).  No results found for this or any previous visit (from the past 240 hour(s)).    Radiology Studies: DG Abd 1 View  Result Date: 05/23/2023 CLINICAL DATA:  Small-bowel obstruction EXAM: ABDOMEN - 1 VIEW COMPARISON:  05/23/2023 FINDINGS: 2 supine frontal views of the abdomen and pelvis are obtained 24 hours after oral contrast administration. The oral contrast administered previously has  progressed into the colon. There is persistent gaseous distension of the small bowel measuring up to 4.6 cm however. No abdominal masses or abnormal calcifications. Lung bases are clear. IMPRESSION: 1. Persistent gaseous distention of the small bowel, though oral contrast has progressed into the colon by the time of imaging. This is consistent with incomplete or resolving small bowel obstruction or ileus. Electronically Signed   By: Sharlet Salina M.D.   On: 05/23/2023 19:24   DG Abd Portable 1V-Small Bowel Obstruction Protocol-initial, 8 hr delay  Result Date: 05/23/2023 CLINICAL DATA:  Small bowel obstruction EXAM: PORTABLE ABDOMEN - 1 VIEW COMPARISON:  CT 05/22/2023 FINDINGS: Dilated left abdominal small bowel loops again noted as seen on prior CT. Contrast material seen within the stomach. Contrast seen within the bladder from earlier CT. No free air or organomegaly. IMPRESSION: Dilated left abdominal small bowel loops compatible with small bowel obstruction. Electronically Signed   By: Charlett Nose M.D.   On: 05/23/2023 01:54   CT ABDOMEN PELVIS W CONTRAST  Result Date: 05/22/2023 CLINICAL DATA:  Acute generalized abdominal pain for couple of days. EXAM: CT ABDOMEN AND PELVIS WITH CONTRAST TECHNIQUE: Multidetector CT imaging of the abdomen and pelvis was performed using the standard protocol following bolus administration of intravenous contrast. RADIATION DOSE REDUCTION: This exam was performed according to the departmental dose-optimization program which includes automated exposure control, adjustment of the mA and/or kV according to patient size and/or use of iterative reconstruction technique. CONTRAST:  OMNIPAQUE IOHEXOL 300 MG/ML  SOLN COMPARISON:  June 05, 2021.  August 02, 2009. FINDINGS: Lower chest: No acute abnormality. Hepatobiliary: No focal liver abnormality is seen. No gallstones, gallbladder wall thickening, or biliary dilatation. Pancreas: Unremarkable. No pancreatic ductal  dilatation or surrounding inflammatory changes. Spleen: Normal in size without focal abnormality. Adrenals/Urinary Tract: Adrenal glands are unremarkable. Kidneys are normal, without renal calculi, focal lesion, or hydronephrosis. Bladder is unremarkable. Stomach/Bowel: Status post appendectomy. Stomach is unremarkable. Mildly dilated small bowel loops are noted without definite transition zone concerning for ileus or distal small bowel obstruction. No colonic dilatation is noted. Sigmoid diverticulosis without inflammation. Vascular/Lymphatic: No significant vascular findings are present. No enlarged abdominal or pelvic lymph nodes. Reproductive: Prostate is unremarkable. Other: No abdominal wall hernia or abnormality. No abdominopelvic ascites. Musculoskeletal: No acute or significant osseous findings. IMPRESSION: Mildly dilated small bowel loops are noted concerning for ileus or distal small bowel obstruction. Sigmoid diverticulosis without inflammation. Electronically Signed   By: Lupita Raider M.D.   On: 05/22/2023 13:03      LOS: 2 days    Jacquelin Hawking, MD [facility] 05/24/2023, 12:22 PM   If 7PM-7AM, please contact night-coverage [email or web address]

## 2023-05-24 NOTE — Progress Notes (Addendum)
Subjective/Chief Complaint: Denies pain, but felt when he was drinking liquids yesterday that they were moving very well.  He had 2 bowel movements yesterday and continues to pass flatus.   Objective: Vital signs in last 24 hours: Temp:  [98 F (36.7 C)-98.6 F (37 C)] 98.6 F (37 C) (09/01 0421) Pulse Rate:  [75-81] 75 (09/01 0421) Resp:  [14-15] 14 (09/01 0421) BP: (142-150)/(82-87) 142/83 (09/01 0421) SpO2:  [96 %-100 %] 96 % (09/01 0421) Last BM Date : 05/21/23  Intake/Output from previous day: 08/31 0701 - 09/01 0700 In: 1810 [P.O.:1810] Out: -  Intake/Output this shift: No intake/output data recorded.  GI: Soft nontender without rebound or guarding.  Nondistended  Lab Results:  Recent Labs    05/22/23 1029 05/23/23 0540  WBC 15.0* 10.5  HGB 15.7 14.0  HCT 46.0 42.8  PLT 249 234   BMET Recent Labs    05/22/23 1029 05/23/23 0540  NA 136 139  K 3.3* 3.0*  CL 103 106  CO2 23 23  GLUCOSE 276* 137*  BUN 10 12  CREATININE 0.58* 0.61  CALCIUM 9.0 8.4*   PT/INR No results for input(s): "LABPROT", "INR" in the last 72 hours. ABG No results for input(s): "PHART", "HCO3" in the last 72 hours.  Invalid input(s): "PCO2", "PO2"  Studies/Results: DG Abd 1 View  Result Date: 05/23/2023 CLINICAL DATA:  Small-bowel obstruction EXAM: ABDOMEN - 1 VIEW COMPARISON:  05/23/2023 FINDINGS: 2 supine frontal views of the abdomen and pelvis are obtained 24 hours after oral contrast administration. The oral contrast administered previously has progressed into the colon. There is persistent gaseous distension of the small bowel measuring up to 4.6 cm however. No abdominal masses or abnormal calcifications. Lung bases are clear. IMPRESSION: 1. Persistent gaseous distention of the small bowel, though oral contrast has progressed into the colon by the time of imaging. This is consistent with incomplete or resolving small bowel obstruction or ileus. Electronically Signed   By:  Sharlet Salina M.D.   On: 05/23/2023 19:24   DG Abd Portable 1V-Small Bowel Obstruction Protocol-initial, 8 hr delay  Result Date: 05/23/2023 CLINICAL DATA:  Small bowel obstruction EXAM: PORTABLE ABDOMEN - 1 VIEW COMPARISON:  CT 05/22/2023 FINDINGS: Dilated left abdominal small bowel loops again noted as seen on prior CT. Contrast material seen within the stomach. Contrast seen within the bladder from earlier CT. No free air or organomegaly. IMPRESSION: Dilated left abdominal small bowel loops compatible with small bowel obstruction. Electronically Signed   By: Charlett Nose M.D.   On: 05/23/2023 01:54   CT ABDOMEN PELVIS W CONTRAST  Result Date: 05/22/2023 CLINICAL DATA:  Acute generalized abdominal pain for couple of days. EXAM: CT ABDOMEN AND PELVIS WITH CONTRAST TECHNIQUE: Multidetector CT imaging of the abdomen and pelvis was performed using the standard protocol following bolus administration of intravenous contrast. RADIATION DOSE REDUCTION: This exam was performed according to the departmental dose-optimization program which includes automated exposure control, adjustment of the mA and/or kV according to patient size and/or use of iterative reconstruction technique. CONTRAST:  OMNIPAQUE IOHEXOL 300 MG/ML  SOLN COMPARISON:  June 05, 2021.  August 02, 2009. FINDINGS: Lower chest: No acute abnormality. Hepatobiliary: No focal liver abnormality is seen. No gallstones, gallbladder wall thickening, or biliary dilatation. Pancreas: Unremarkable. No pancreatic ductal dilatation or surrounding inflammatory changes. Spleen: Normal in size without focal abnormality. Adrenals/Urinary Tract: Adrenal glands are unremarkable. Kidneys are normal, without renal calculi, focal lesion, or hydronephrosis. Bladder is unremarkable. Stomach/Bowel:  Status post appendectomy. Stomach is unremarkable. Mildly dilated small bowel loops are noted without definite transition zone concerning for ileus or distal small  bowel obstruction. No colonic dilatation is noted. Sigmoid diverticulosis without inflammation. Vascular/Lymphatic: No significant vascular findings are present. No enlarged abdominal or pelvic lymph nodes. Reproductive: Prostate is unremarkable. Other: No abdominal wall hernia or abnormality. No abdominopelvic ascites. Musculoskeletal: No acute or significant osseous findings. IMPRESSION: Mildly dilated small bowel loops are noted concerning for ileus or distal small bowel obstruction. Sigmoid diverticulosis without inflammation. Electronically Signed   By: Lupita Raider M.D.   On: 05/22/2023 13:03    Anti-infectives: Anti-infectives (From admission, onward)    None       Assessment/Plan: leus vs pSBO   Contrast reached the colon on 8-hour delayed film, though still some gaseous distention of small bowel.  He is having bowel function and abdominal exam is benign.  Will advance to full liquids today.  Will order oral potassium replacement and recheck tomorrow for hypokalemia 3.0 on yesterday's labs.   LOS: 2 days    Berna Bue 05/24/2023 Moderate

## 2023-05-25 DIAGNOSIS — K567 Ileus, unspecified: Secondary | ICD-10-CM | POA: Diagnosis not present

## 2023-05-25 LAB — BASIC METABOLIC PANEL
Anion gap: 12 (ref 5–15)
BUN: 6 mg/dL (ref 6–20)
CO2: 21 mmol/L — ABNORMAL LOW (ref 22–32)
Calcium: 8.5 mg/dL — ABNORMAL LOW (ref 8.9–10.3)
Chloride: 106 mmol/L (ref 98–111)
Creatinine, Ser: 0.63 mg/dL (ref 0.61–1.24)
GFR, Estimated: >60 mL/min (ref >60)
Glucose, Bld: 110 mg/dL — ABNORMAL HIGH (ref 70–99)
Potassium: 3.3 mmol/L — ABNORMAL LOW (ref 3.5–5.1)
Sodium: 139 mmol/L (ref 135–145)

## 2023-05-25 LAB — MAGNESIUM: Magnesium: 2.3 mg/dL (ref 1.7–2.4)

## 2023-05-25 LAB — GLUCOSE, CAPILLARY
Glucose-Capillary: 115 mg/dL — ABNORMAL HIGH (ref 70–99)
Glucose-Capillary: 92 mg/dL (ref 70–99)

## 2023-05-25 NOTE — Progress Notes (Signed)
   05/25/23 1001  TOC Brief Assessment  Insurance and Status Reviewed  Patient has primary care physician Yes  Home environment has been reviewed home  Prior level of function: independent  Prior/Current Home Services No current home services  Social Determinants of Health Reivew SDOH reviewed no interventions necessary  Readmission risk has been reviewed Yes  Transition of care needs no transition of care needs at this time

## 2023-05-25 NOTE — Discharge Instructions (Addendum)
Robert Lewis,  You were in the hospital with a partial bowel obstruction vs a possible ileus. This situations cause your bowel to not function properly. Thankfully you were able to avoid the need for surgery. Your diet has been advanced and the surgeon is okay with you going home. Please continue a low fiber diet for the next two weeks. Please follow-up with your primary care physician.

## 2023-05-25 NOTE — Plan of Care (Signed)
  Problem: Clinical Measurements: Goal: Diagnostic test results will improve Outcome: Progressing   Problem: Clinical Measurements: Goal: Cardiovascular complication will be avoided Outcome: Adequate for Discharge   Problem: Activity: Goal: Risk for activity intolerance will decrease Outcome: Adequate for Discharge

## 2023-05-25 NOTE — Discharge Summary (Signed)
Physician Discharge Summary   Patient: Robert Lewis MRN: [id number] DOB: [birth year]  Admit date:     05/22/2023  Discharge date: 05/25/2023  Discharge Physician: Jacquelin Hawking, MD   PCP: Melida Quitter, PA   Recommendations at discharge:  Hospital follow-up with PCP  Discharge Diagnoses: Principal Problem:   Ileus [facility]) Active Problems:   Essential hypertension   Morbid obesity (HCC)   Type 2 diabetes mellitus without complication, without long-term current use of insulin (HCC)  Resolved Problems:   * No resolved hospital problems. *  Hospital Course: Robert Lewis is a 57 y.o. male with a history of diabetes mellitus type 2, morbid obesity, hypertension.  Patient presented secondary to abdominal pain with evidence of possible partial small bowel obstruction versus ileus.  General surgery consulted.  Bowel rest initiated with improvement of symptoms. Diet advanced successfully.  Assessment and Plan:  Ileus versus partial small bowel obstruction Unclear diagnosis. General Surgery consulted.  Patient managed conservatively with bowel rest and IV fluids.  SBO protocol started. Repeat abdominal x-rays show contrast into the colon. Diet advanced successfully with patient able to have multiple bowel movements prior to discharge.   Diabetes mellitus type 2 Patient is on metformin and Ozempic as an outpatient.  Diabetes is poorly controlled with hyperglycemia as indicated by hemoglobin A1c of 11.2%.  Patient started on sliding scale insulin for inpatient management. Continue home regimen on discharge.   Primary hypertension Patient is on amlodipine and lisinopril as an outpatient which were restarted on admission. Continue on discharge.   Leukocytosis Likely reactive.  No evidence of acute infection at this time.  Leukocytosis was mild at 15,000 and has resolved.   Morbid obesity Estimated body mass index is 44.94 kg/m as calculated from the following:   Height as of  this encounter: 6\' 2"  (1.88 m).   Weight as of this encounter: 158.8 kg.  Consultants: General surgery Procedures performed: None  Disposition: Home Diet recommendation: Carb modified diet   DISCHARGE MEDICATION: Allergies as of 05/25/2023   No Known Allergies      Medication List     STOP taking these medications    tirzepatide 2.5 MG/0.5ML Pen Commonly known as: MOUNJARO       TAKE these medications    Accu-Chek Guide test strip Generic drug: glucose blood DISPENSE BASED ON PATIENT AND INSURANCE PREFERENCE. USE UP TO FOUR TIMES DAILY AS DIRECTED.   Accu-Chek Softclix Lancets lancets See admin instructions.   amLODipine 10 MG tablet Commonly known as: NORVASC Take 1 tablet (10 mg total) by mouth daily.   Artificial Tears PF 0.1-0.3 % Soln Generic drug: Dextran 70-Hypromellose (PF) Place 1 drop into both eyes 3 (three) times daily as needed (for dryness).   ascorbic acid 500 MG tablet Commonly known as: VITAMIN C Take 1,000 mg by mouth daily.   blood glucose meter kit and supplies Kit Dispense based on patient and insurance preference. Use up to four times daily as directed.   Blood Pressure Kit 1 each by Does not apply route 2 (two) times daily.   dapagliflozin propanediol 10 MG Tabs tablet Commonly known as: Farxiga Take 1 tablet (10 mg total) by mouth daily before breakfast.   lisinopril 10 MG tablet Commonly known as: ZESTRIL Take 1 tablet (10 mg total) by mouth daily.   metFORMIN 500 MG 24 hr tablet Commonly known as: GLUCOPHAGE-XR Take 1 tablet (500 mg total) by mouth 2 (two) times daily with a meal. What changed: when to  take this   MiraLax 17 GM/SCOOP powder Generic drug: polyethylene glycol powder Take 17 g by mouth daily as needed for mild constipation (mix and drink as directed).   oxyCODONE-acetaminophen 10-325 MG tablet Commonly known as: PERCOCET Take 1 tablet by mouth every 6 (six) hours as needed for pain.   Ozempic (0.25 or 0.5  MG/DOSE) 2 MG/1.5ML Sopn Generic drug: Semaglutide(0.25 or 0.5MG /DOS) Inject 0.5 mg into the skin every Thursday.   Vitamin B6 100 MG Tabs Take 200 mg by mouth daily.        Follow-up Information     Melida Quitter, PA. Schedule an appointment as soon as possible for a visit in 1 week(s).   Specialty: Family Medicine Why: For hospital follow-up Contact information: [address] [place] [address] [phone]                Discharge Exam: BP (!) 147/58 (BP Location: Right Arm)   Pulse 65   Temp 98.1 F (36.7 C) (Oral)   Resp 14   Ht 6\' 2"  (1.88 m)   Wt (!) 158.8 kg   SpO2 97%   BMI 44.94 kg/m   General exam: Appears calm and comfortable Respiratory system: Respiratory effort normal. Gastrointestinal system: Abdomen is obese, soft and nontender. Normal bowel sounds heard. Central nervous system: Alert and oriented. Psychiatry: Judgement and insight appear normal. Mood & affect appropriate.   Condition at discharge: stable  The results of significant diagnostics from this hospitalization (including imaging, microbiology, ancillary and laboratory) are listed below for reference.   Imaging Studies: DG Abd 1 View  Result Date: 05/23/2023 CLINICAL DATA:  Small-bowel obstruction EXAM: ABDOMEN - 1 VIEW COMPARISON:  05/23/2023 FINDINGS: 2 supine frontal views of the abdomen and pelvis are obtained 24 hours after oral contrast administration. The oral contrast administered previously has progressed into the colon. There is persistent gaseous distension of the small bowel measuring up to 4.6 cm however. No abdominal masses or abnormal calcifications. Lung bases are clear. IMPRESSION: 1. Persistent gaseous distention of the small bowel, though oral contrast has progressed into the colon by the time of imaging. This is consistent with incomplete or resolving small bowel obstruction or ileus. Electronically Signed   By: Sharlet Salina M.D.   On: 05/23/2023  19:24   DG Abd Portable 1V-Small Bowel Obstruction Protocol-initial, 8 hr delay  Result Date: 05/23/2023 CLINICAL DATA:  Small bowel obstruction EXAM: PORTABLE ABDOMEN - 1 VIEW COMPARISON:  CT 05/22/2023 FINDINGS: Dilated left abdominal small bowel loops again noted as seen on prior CT. Contrast material seen within the stomach. Contrast seen within the bladder from earlier CT. No free air or organomegaly. IMPRESSION: Dilated left abdominal small bowel loops compatible with small bowel obstruction. Electronically Signed   By: Charlett Nose M.D.   On: 05/23/2023 01:54   CT ABDOMEN PELVIS W CONTRAST  Result Date: 05/22/2023 CLINICAL DATA:  Acute generalized abdominal pain for couple of days. EXAM: CT ABDOMEN AND PELVIS WITH CONTRAST TECHNIQUE: Multidetector CT imaging of the abdomen and pelvis was performed using the standard protocol following bolus administration of intravenous contrast. RADIATION DOSE REDUCTION: This exam was performed according to the departmental dose-optimization program which includes automated exposure control, adjustment of the mA and/or kV according to patient size and/or use of iterative reconstruction technique. CONTRAST:  OMNIPAQUE IOHEXOL 300 MG/ML  SOLN COMPARISON:  June 05, 2021.  August 02, 2009. FINDINGS: Lower chest: No acute abnormality. Hepatobiliary: No focal liver abnormality  is seen. No gallstones, gallbladder wall thickening, or biliary dilatation. Pancreas: Unremarkable. No pancreatic ductal dilatation or surrounding inflammatory changes. Spleen: Normal in size without focal abnormality. Adrenals/Urinary Tract: Adrenal glands are unremarkable. Kidneys are normal, without renal calculi, focal lesion, or hydronephrosis. Bladder is unremarkable. Stomach/Bowel: Status post appendectomy. Stomach is unremarkable. Mildly dilated small bowel loops are noted without definite transition zone concerning for ileus or distal small bowel obstruction. No colonic  dilatation is noted. Sigmoid diverticulosis without inflammation. Vascular/Lymphatic: No significant vascular findings are present. No enlarged abdominal or pelvic lymph nodes. Reproductive: Prostate is unremarkable. Other: No abdominal wall hernia or abnormality. No abdominopelvic ascites. Musculoskeletal: No acute or significant osseous findings. IMPRESSION: Mildly dilated small bowel loops are noted concerning for ileus or distal small bowel obstruction. Sigmoid diverticulosis without inflammation. Electronically Signed   By: Lupita Raider M.D.   On: 05/22/2023 13:03    Microbiology: Results for orders placed or performed during the hospital encounter of 06/05/21  Culture, blood (routine x 2)     Status: None   Collection Time: 06/05/21  4:01 PM   Specimen: BLOOD  Result Value Ref Range Status   Specimen Description   Final    BLOOD BLOOD LEFT FOREARM Performed at [facility], [address], [place], [place] [address]    Special Requests   Final    Blood Culture adequate volume BOTTLES DRAWN AEROBIC AND ANAEROBIC Performed at [facility], [address] [place], [place] [address]    Culture   Final    NO GROWTH 5 DAYS Performed at [facility], [address], [place], [place] [address]    Report Status 06/10/2021 FINAL  Final  Resp Panel by RT-PCR (Flu A&B, Covid) Nasopharyngeal Swab     Status: None   Collection Time: 06/05/21  4:03 PM   Specimen: Nasopharyngeal Swab; Nasopharyngeal(NP) swabs in vial transport medium  Result Value Ref Range Status   SARS Coronavirus 2 by RT PCR NEGATIVE NEGATIVE Final    Comment: (NOTE) SARS-CoV-2 target nucleic acids are NOT DETECTED.  The SARS-CoV-2 RNA is generally detectable in upper respiratory specimens during the acute phase of infection. The lowest concentration of SARS-CoV-2 viral copies this assay can detect is 138 copies/mL. A negative result does not preclude SARS-Cov-2 infection  and should not be used as the sole basis for treatment or other patient management decisions. A negative result may occur with  improper specimen collection/handling, submission of specimen other than nasopharyngeal swab, presence of viral mutation(s) within the areas targeted by this assay, and inadequate number of viral copies(<138 copies/mL). A negative result must be combined with clinical observations, patient history, and epidemiological information. The expected result is Negative.  Fact Sheet for Patients:  BloggerCourse.com  Fact Sheet for Healthcare Providers:  SeriousBroker.it  This test is no t yet approved or cleared by the [place] FDA and  has been authorized for detection and/or diagnosis of SARS-CoV-2 by FDA under an Emergency Use Authorization (EUA). This EUA will remain  in effect (meaning this test can be used) for the duration of the COVID-19 declaration under Section 564(b)(1) of the Act, 21 U.S.C.section 360bbb-3(b)(1), unless the authorization is terminated  or revoked sooner.       Influenza A by PCR NEGATIVE NEGATIVE Final   Influenza B by PCR NEGATIVE NEGATIVE Final    Comment: (NOTE) The Xpert Xpress SARS-CoV-2/FLU/RSV plus assay is intended as an aid in the diagnosis of influenza from Nasopharyngeal swab specimens  and should not be used as a sole basis for treatment. Nasal washings and aspirates are unacceptable for Xpert Xpress SARS-CoV-2/FLU/RSV testing.  Fact Sheet for Patients: BloggerCourse.com  Fact Sheet for Healthcare Providers: SeriousBroker.it  This test is not yet approved or cleared by the [place] FDA and has been authorized for detection and/or diagnosis of SARS-CoV-2 by FDA under an Emergency Use Authorization (EUA). This EUA will remain in effect (meaning this test can be used) for the duration of the COVID-19 declaration  under Section 564(b)(1) of the Act, 21 U.S.C. section 360bbb-3(b)(1), unless the authorization is terminated or revoked.  Performed at [facility], [address], [place], [place] [address]   Culture, blood (routine x 2)     Status: None   Collection Time: 06/05/21  4:13 PM   Specimen: BLOOD  Result Value Ref Range Status   Specimen Description   Final    BLOOD BLOOD RIGHT HAND Performed at [facility], [address] [place], [place] [address]    Special Requests   Final    Blood Culture adequate volume BOTTLES DRAWN AEROBIC AND ANAEROBIC Performed at [facility], [address], [place], [place] [address]    Culture   Final    NO GROWTH 5 DAYS Performed at [facility], [address], [place], [place] [address]    Report Status 06/10/2021 FINAL  Final  Aerobic/Anaerobic Culture w Gram Stain (surgical/deep wound)     Status: None   Collection Time: 06/05/21  8:06 PM   Specimen: Wound; Abscess  Result Value Ref Range Status   Specimen Description   Final    WOUND RIGHT SCROTUM Performed at [facility], [address], [place], [place] [address]    Special Requests   Final    NONE Performed at [facility], [address] [place], [place] [address]    Gram Stain   Final    ABUNDANT WBC PRESENT,BOTH PMN AND MONONUCLEAR ABUNDANT GRAM POSITIVE COCCI IN PAIRS AND CHAINS FEW GRAM NEGATIVE RODS Performed at [facility], [address], [place], [place] [address]    Culture   Final    ABUNDANT ENTEROCOCCUS FAECALIS MIXED ANAEROBIC FLORA PRESENT.  CALL LAB IF FURTHER IID REQUIRED.    Report Status 06/09/2021 FINAL  Final   Organism ID, Bacteria ENTEROCOCCUS FAECALIS  Final      Susceptibility   Enterococcus faecalis - MIC*    AMPICILLIN <=2 SENSITIVE Sensitive     VANCOMYCIN 2 SENSITIVE Sensitive     GENTAMICIN SYNERGY SENSITIVE Sensitive     * ABUNDANT  ENTEROCOCCUS FAECALIS    Labs: CBC: Recent Labs  Lab 05/22/23 1029 05/23/23 0540  WBC 15.0* 10.5  HGB 15.7 14.0  HCT 46.0 42.8  MCV 90.9 92.8  PLT 249 234   Basic Metabolic Panel: Recent Labs  Lab 05/22/23 1029 05/23/23 0540 05/24/23 1150 05/25/23 0439  NA 136 139 137 139  K 3.3* 3.0* 3.7 3.3*  CL 103 106 103 106  CO2 23 23 26  21*  GLUCOSE 276* 137* 182* 110*  BUN 10 12 9 6   CREATININE 0.58* 0.61 0.70 0.63  CALCIUM 9.0 8.4* 8.2* 8.5*  MG  --   --  2.1 2.3   Liver Function Tests: Recent Labs  Lab 05/22/23 1029  AST 15  ALT 29  ALKPHOS 61  BILITOT 0.6  PROT 7.1  ALBUMIN 3.9   CBG: Recent Labs  Lab 05/24/23 0008  05/24/23 0419 05/24/23 2023 05/25/23 0001 05/25/23 0406  GLUCAP 107* 132* 156* 92 115*    Discharge time spent: 35 minutes.  Signed: Jacquelin Hawking, MD [facility] 05/25/2023

## 2023-05-25 NOTE — Progress Notes (Signed)
   Subjective/Chief Complaint: Pt tolerating diet , no abdominal pain and had BM  No N/V   Objective: Vital signs in last 24 hours: Temp:  [98.1 F (36.7 C)-98.6 F (37 C)] 98.1 F (36.7 C) (09/02 0409) Pulse Rate:  [65-80] 65 (09/02 0409) Resp:  [14-20] 14 (09/02 0409) BP: (140-159)/(58-88) 147/58 (09/02 0409) SpO2:  [97 %-98 %] 97 % (09/02 0409) Last BM Date : 05/24/23  Intake/Output from previous day: 09/01 0701 - 09/02 0700 In: 2160 [P.O.:2160] Out: 0  Intake/Output this shift: No intake/output data recorded.  Abdomen: obese but soft NT ND   Lab Results:  Recent Labs    05/22/23 1029 05/23/23 0540  WBC 15.0* 10.5  HGB 15.7 14.0  HCT 46.0 42.8  PLT 249 234   BMET Recent Labs    05/24/23 1150 05/25/23 0439  NA 137 139  K 3.7 3.3*  CL 103 106  CO2 26 21*  GLUCOSE 182* 110*  BUN 9 6  CREATININE 0.70 0.63  CALCIUM 8.2* 8.5*   PT/INR No results for input(s): "LABPROT", "INR" in the last 72 hours. ABG No results for input(s): "PHART", "HCO3" in the last 72 hours.  Invalid input(s): "PCO2", "PO2"  Studies/Results: DG Abd 1 View  Result Date: 05/23/2023 CLINICAL DATA:  Small-bowel obstruction EXAM: ABDOMEN - 1 VIEW COMPARISON:  05/23/2023 FINDINGS: 2 supine frontal views of the abdomen and pelvis are obtained 24 hours after oral contrast administration. The oral contrast administered previously has progressed into the colon. There is persistent gaseous distension of the small bowel measuring up to 4.6 cm however. No abdominal masses or abnormal calcifications. Lung bases are clear. IMPRESSION: 1. Persistent gaseous distention of the small bowel, though oral contrast has progressed into the colon by the time of imaging. This is consistent with incomplete or resolving small bowel obstruction or ileus. Electronically Signed   By: Sharlet Salina M.D.   On: 05/23/2023 19:24    Anti-infectives: Anti-infectives (From admission, onward)    None        Assessment/Plan: Ileus / ?psbo Improved  Tolerating diet Can be D/C home today from surgery standpoint  Discussed low residue diet for 2 weeks and avoid large meals  Drink plenty of water    LOS: 3 days    Clovis Pu Sumedha Munnerlyn MD  05/25/2023 LOW COMPLEXITY

## 2023-05-25 NOTE — Progress Notes (Addendum)
Discharge instructions given to patient and all questions were answered. Patient medicines given back form pharmacy.

## 2023-05-26 LAB — GLUCOSE, CAPILLARY
Glucose-Capillary: 102 mg/dL — ABNORMAL HIGH (ref 70–99)
Glucose-Capillary: 126 mg/dL — ABNORMAL HIGH (ref 70–99)
Glucose-Capillary: 127 mg/dL — ABNORMAL HIGH (ref 70–99)
Glucose-Capillary: 128 mg/dL — ABNORMAL HIGH (ref 70–99)
Glucose-Capillary: 128 mg/dL — ABNORMAL HIGH (ref 70–99)
Glucose-Capillary: 136 mg/dL — ABNORMAL HIGH (ref 70–99)
Glucose-Capillary: 149 mg/dL — ABNORMAL HIGH (ref 70–99)
Glucose-Capillary: 172 mg/dL — ABNORMAL HIGH (ref 70–99)
Glucose-Capillary: 182 mg/dL — ABNORMAL HIGH (ref 70–99)
Glucose-Capillary: 193 mg/dL — ABNORMAL HIGH (ref 70–99)
Glucose-Capillary: 200 mg/dL — ABNORMAL HIGH (ref 70–99)
Glucose-Capillary: 86 mg/dL (ref 70–99)

## 2023-05-28 ENCOUNTER — Telehealth: Payer: Self-pay

## 2023-05-28 NOTE — Transitions of Care (Post Inpatient/ED Visit) (Signed)
   05/28/2023  Name: Robert Lewis MRN: [id number] DOB: 04-Sep-1966  Today's TOC FU Call Status: Today's TOC FU Call Status:: Unsuccessful Call (1st Attempt) Unsuccessful Call (1st Attempt) Date: 05/28/23  Attempted to reach the patient regarding the most recent Inpatient/ED visit.  Follow Up Plan: Additional outreach attempts will be made to reach the patient to complete the Transitions of Care (Post Inpatient/ED visit) call.   Jodelle Gross RN, BSN, CCM [facility] RN Care Coordinator/ Transitions of Care Direct Dial: [phone]  Fax: [phone]

## 2023-05-29 ENCOUNTER — Telehealth: Payer: Self-pay

## 2023-05-29 NOTE — Transitions of Care (Post Inpatient/ED Visit) (Signed)
   05/29/2023  Name: Robert Lewis MRN: [id number] DOB: 1965-10-21  Today's TOC FU Call Status: Today's TOC FU Call Status:: Unsuccessful Call (2nd Attempt) Unsuccessful Call (2nd Attempt) Date: 05/29/23  Attempted to reach the patient regarding the most recent Inpatient/ED visit.  Follow Up Plan: Additional outreach attempts will be made to reach the patient to complete the Transitions of Care (Post Inpatient/ED visit) call.   Jodelle Gross RN, BSN, CCM [facility] RN Care Coordinator/ Transitions of Care Direct Dial: [phone]  Fax: [phone]

## 2023-06-01 ENCOUNTER — Telehealth: Payer: Self-pay

## 2023-06-01 NOTE — Transitions of Care (Post Inpatient/ED Visit) (Signed)
   06/01/2023  Name: Robert Lewis MRN: [id number] DOB: July 16, 1966  Today's TOC FU Call Status: Today's TOC FU Call Status:: Unsuccessful Call (3rd Attempt) Unsuccessful Call (3rd Attempt) Date: 06/01/23  Attempted to reach the patient regarding the most recent Inpatient/ED visit.  Follow Up Plan: No further outreach attempts will be made at this time. We have been unable to contact the patient.  Jodelle Gross RN, BSN, CCM [facility] RN Care Coordinator/ Transitions of Care Direct Dial: [phone]  Fax: [phone]

## 2023-06-03 ENCOUNTER — Ambulatory Visit: Payer: BC Managed Care – PPO | Admitting: Family Medicine

## 2023-06-03 ENCOUNTER — Encounter: Payer: Self-pay | Admitting: Family Medicine

## 2023-06-03 VITALS — BP 162/80 | HR 64 | Ht 74.0 in | Wt 362.1 lb

## 2023-06-03 DIAGNOSIS — K567 Ileus, unspecified: Secondary | ICD-10-CM

## 2023-06-03 DIAGNOSIS — E119 Type 2 diabetes mellitus without complications: Secondary | ICD-10-CM

## 2023-06-03 DIAGNOSIS — F419 Anxiety disorder, unspecified: Secondary | ICD-10-CM

## 2023-06-03 DIAGNOSIS — I10 Essential (primary) hypertension: Secondary | ICD-10-CM

## 2023-06-03 DIAGNOSIS — Z1211 Encounter for screening for malignant neoplasm of colon: Secondary | ICD-10-CM

## 2023-06-03 DIAGNOSIS — Z7984 Long term (current) use of oral hypoglycemic drugs: Secondary | ICD-10-CM

## 2023-06-03 NOTE — Patient Instructions (Signed)
It was nice to see you today,  We addressed the following topics today: -It is okay to restart taking your medications.  I would avoid taking the oxycodone if you have any left - For weight loss I would recommend eating a diet low in carbohydrates.  Try to eliminate from your diet the following: Breads, pastas, starchy vegetables such as potatoes, corn, any beverage with sugar in it. - I will send in a referral for a colonoscopy - I will recheck your potassium levels today. - I would like you to follow-up with your primary care provider in the next 1 to 2 months to discuss your long-term diabetes management. - I have provided information on a different diabetes medication  Have a great day,  Frederic Jericho, MD

## 2023-06-03 NOTE — Progress Notes (Unsigned)
   Established Patient Office Visit  Subjective   Patient ID: Robert Lewis, male    DOB: [birth year]  Age: 57 y.o. MRN: [id number]  Chief Complaint  Patient presents with   Hospitalization Follow-up    HPI SBO/ileus patient is doing better since his discharge.  He is able to tolerate diet and have regular bowel movements.  Never had any nausea or vomiting just abdominal pain.  Abdominal pain is resolved.  He is worried about what would happen if he took his medication again and therefore has not taken any of his oral medicines since being discharged.  He has taken his Ozempic already.  We discussed what could have possibly caused his ileus/SBO.  He only has a history of 1 laparoscopic appendectomy and no other abdominal surgeries.  He is taking Ozempic which increases the risk of ileus but was only taking the lowest dose.  He also started taking oxycodone after surgery for shoulder roughly the same time he developed the symptoms.  He is since stopped taking oxycodone.  Hypokalemia-discussed repeating his potassium labs which are likely low due to decreased food intake during his hospitalization.  Hypertension-patient admits to have not taken any of his blood pressure medication since being discharged.  We discussed that it is okay for him to start taking his medications and that his blood pressure medicines should not cause his his abdominal symptoms.  Patient mentioned having a few panic attacks since his shoulder surgery.  Feels like all of a sudden he gets heart racing and breathing fast and needs to leave his house.  He has not had an episode like this for 8 weeks.  Does not want to take medications for this.  We discussed deep breathing exercises as a initial treatment if this happens again.  Patient desires a colonoscopy.  Has a Cologuard at home but has never used it and decided he should just get it evaluated now that he has had these abdominal issues   The 10-year ASCVD risk score  (Arnett DK, et al., 2019) is: 18.2%  Health Maintenance Due  Topic Date Due   Hepatitis C Screening  Never done   Colonoscopy  Never done   Zoster Vaccines- Shingrix (1 of 2) Never done   OPHTHALMOLOGY EXAM  10/14/2021   FOOT EXAM  12/31/2022   INFLUENZA VACCINE  Never done   COVID-19 Vaccine (1 - 2023-24 season) Never done      Objective:     BP (!) 162/80   Pulse 64   Ht 6\' 2"  (1.88 m)   Wt (!) 362 lb 1.9 oz (164.3 kg)   SpO2 98%   BMI 46.49 kg/m  {Vitals History (Optional):23777}  Physical Exam General: Alert and oriented CV: Regular rhythm Pulmonary: Lungs are bilaterally GI: Soft, normal bowel sounds.  Large pannus MSK: Left arm in a brace.   No results found for any visits on 06/03/23.      Assessment & Plan:   Encounter for colorectal cancer screening -     Ambulatory referral to Gastroenterology  Ileus (HCC) -     CBC With Differential -     Comprehensive metabolic panel     Return in about 4 weeks (around 07/01/2023).    Sandre Kitty, MD

## 2023-06-04 LAB — CBC WITH DIFFERENTIAL/PLATELET
Basophils Absolute: 0.1 10*3/uL (ref 0.0–0.2)
Basos: 1 %
EOS (ABSOLUTE): 0.2 10*3/uL (ref 0.0–0.4)
Eos: 1 %
Hematocrit: 45.2 % (ref 37.5–51.0)
Hemoglobin: 14.4 g/dL (ref 13.0–17.7)
Immature Grans (Abs): 0.1 10*3/uL (ref 0.0–0.1)
Immature Granulocytes: 1 %
Lymphocytes Absolute: 1.9 10*3/uL (ref 0.7–3.1)
Lymphs: 17 %
MCH: 29.5 pg (ref 26.6–33.0)
MCHC: 31.9 g/dL (ref 31.5–35.7)
MCV: 93 fL (ref 79–97)
Monocytes Absolute: 0.7 10*3/uL (ref 0.1–0.9)
Monocytes: 6 %
Neutrophils Absolute: 8.3 10*3/uL — ABNORMAL HIGH (ref 1.4–7.0)
Neutrophils: 74 %
Platelets: 265 10*3/uL (ref 150–450)
RBC: 4.88 x10E6/uL (ref 4.14–5.80)
RDW: 12.9 % (ref 11.6–15.4)
WBC: 11.1 10*3/uL — ABNORMAL HIGH (ref 3.4–10.8)

## 2023-06-04 LAB — COMPREHENSIVE METABOLIC PANEL
ALT: 34 IU/L (ref 0–44)
AST: 20 IU/L (ref 0–40)
Albumin: 3.9 g/dL (ref 3.8–4.9)
Alkaline Phosphatase: 70 IU/L (ref 44–121)
BUN/Creatinine Ratio: 11 (ref 9–20)
BUN: 7 mg/dL (ref 6–24)
Bilirubin Total: 0.3 mg/dL (ref 0.0–1.2)
CO2: 21 mmol/L (ref 20–29)
Calcium: 8.6 mg/dL — ABNORMAL LOW (ref 8.7–10.2)
Chloride: 104 mmol/L (ref 96–106)
Creatinine, Ser: 0.66 mg/dL — ABNORMAL LOW (ref 0.76–1.27)
Globulin, Total: 2.2 g/dL (ref 1.5–4.5)
Glucose: 223 mg/dL — ABNORMAL HIGH (ref 70–99)
Potassium: 3.8 mmol/L (ref 3.5–5.2)
Sodium: 139 mmol/L (ref 134–144)
Total Protein: 6.1 g/dL (ref 6.0–8.5)
eGFR: 111 mL/min/{1.73_m2} (ref >59)

## 2023-06-04 NOTE — Assessment & Plan Note (Signed)
Advised patient he can continue taking his blood pressure medications.  Has not taken them since discharge.  Blood pressure was elevated today as a result. - Restart home medications. - Recheck at next visit.

## 2023-06-04 NOTE — Assessment & Plan Note (Signed)
Having a few episodes of headaches since having his shoulder operated on.  Seems to be improving over the last few weeks.  Continue to monitor.

## 2023-06-04 NOTE — Assessment & Plan Note (Signed)
Bowel function improved.  Patient tolerating a diet.  Having normal bowel movements.  Patient had already begun taking his Ozempic again.  We discussed how Ozempic can trigger events like this.  Likely was due to combination of oxycodone and the Ozempic.  No longer taking oxycodone.  If this issue recurs again strongly consider stopping his GLP-1.

## 2023-06-04 NOTE — Assessment & Plan Note (Signed)
Patient is already taken his GLP-1 injection in between discharge and his follow-up today.  Discussed with patient the relationship between these medications and gastrointestinal side effects.  Advised patient to restart his other antidiabetic medications.

## 2023-06-16 NOTE — Progress Notes (Signed)
Nurse visit.  Patient taking all medications, reports no side effects.

## 2023-06-26 ENCOUNTER — Other Ambulatory Visit: Payer: Self-pay | Admitting: Family Medicine

## 2023-06-26 DIAGNOSIS — Z1211 Encounter for screening for malignant neoplasm of colon: Secondary | ICD-10-CM

## 2023-06-26 DIAGNOSIS — Z1212 Encounter for screening for malignant neoplasm of rectum: Secondary | ICD-10-CM

## 2023-06-29 ENCOUNTER — Ambulatory Visit: Payer: BC Managed Care – PPO | Admitting: Family Medicine

## 2023-06-29 NOTE — Progress Notes (Deleted)
   Established Patient Office Visit  Subjective   Patient ID: Robert Lewis, male    DOB: 08-19-1966  Age: 57 y.o. MRN: [id number]  No chief complaint on file.   HPI  Why is he following up with me?  He was supposed to follow-up with Lequita Halt.  DM2-Farxiga, metformin, Ozempic. Foot exam, Optho exam?  HTN-amlodipine  Cologuard?       The 10-year ASCVD risk score (Arnett DK, et al., 2019) is: 18.2%  Health Maintenance Due  Topic Date Due   Hepatitis C Screening  Never done   Fecal DNA (Cologuard)  Never done   Zoster Vaccines- Shingrix (1 of 2) Never done   OPHTHALMOLOGY EXAM  10/14/2021   FOOT EXAM  12/31/2022   INFLUENZA VACCINE  Never done   COVID-19 Vaccine (1 - 2023-24 season) Never done      Objective:     There were no vitals taken for this visit. {Vitals History (Optional):23777}  Physical Exam   No results found for any visits on 06/29/23.      Assessment & Plan:   There are no diagnoses linked to this encounter.   No follow-ups on file.    Sandre Kitty, MD

## 2023-06-29 NOTE — Progress Notes (Deleted)
   Established Patient Office Visit  Subjective   Patient ID: Robert Lewis, male    DOB: 1966-02-25  Age: 57 y.o. MRN: [id number]  No chief complaint on file.   HPI Robert Lewis is a 57 y.o. male presenting today for follow up of hypertension, diabetes, recent ileus and increased anxiety.  Presented to primary care with Dr. Constance Goltz on 06/03/2023 for follow-up of ileus.  At that time, tolerating diet and having regular bowel movements and abdominal pain have resolved.  Was not taking oxycodone anymore, but had restarted Ozempic. Hypertension: At last appointment had not resumed antihypertensive medications, discussed with Dr. Constance Goltz that it was safe to do so at that time.  He {is/is not:9024} exercising and {is/is not:9024} adherent to low salt diet.   Pt denies chest pain, SOB, dizziness, edema, syncope, fatigue or heart palpitations. Taking lisinopril and amlodipine again, reports {excellent/good/fair/poor:19665} compliance with treatment. Denies side effects. Diabetes: denies hypoglycemic events, wounds or sores that are not healing well, increased thirst or urination. Denies vision problems, eye exam due. Checking glucose at home, ranges have been ***. Taking *** as prescribed without any side effects. Has been following *** diet and *** for exercise.  Outpatient Medications Prior to Visit  Medication Sig   ACCU-CHEK GUIDE test strip DISPENSE BASED ON PATIENT AND INSURANCE PREFERENCE. USE UP TO FOUR TIMES DAILY AS DIRECTED.   Accu-Chek Softclix Lancets lancets See admin instructions.   amLODipine (NORVASC) 10 MG tablet Take 1 tablet (10 mg total) by mouth daily.   ARTIFICIAL TEARS PF 0.1-0.3 % SOLN Place 1 drop into both eyes 3 (three) times daily as needed (for dryness).   ascorbic acid (VITAMIN C) 500 MG tablet Take 1,000 mg by mouth daily.   blood glucose meter kit and supplies KIT Dispense based on patient and insurance preference. Use up to four times daily as directed.   Blood  Pressure KIT 1 each by Does not apply route 2 (two) times daily.   dapagliflozin propanediol (FARXIGA) 10 MG TABS tablet Take 1 tablet (10 mg total) by mouth daily before breakfast.   lisinopril (ZESTRIL) 10 MG tablet Take 1 tablet (10 mg total) by mouth daily.   metFORMIN (GLUCOPHAGE-XR) 500 MG 24 hr tablet Take 1 tablet (500 mg total) by mouth 2 (two) times daily with a meal. (Patient taking differently: Take 500 mg by mouth daily with breakfast.)   MIRALAX 17 GM/SCOOP powder Take 17 g by mouth daily as needed for mild constipation (mix and drink as directed).   oxyCODONE-acetaminophen (PERCOCET) 10-325 MG tablet Take 1 tablet by mouth every 6 (six) hours as needed for pain.   OZEMPIC, 0.25 OR 0.5 MG/DOSE, 2 MG/1.5ML SOPN Inject 0.5 mg into the skin every Thursday.   Pyridoxine HCl (VITAMIN B6) 100 MG TABS Take 200 mg by mouth daily.   No facility-administered medications prior to visit.    ROS Negative unless otherwise noted in HPI   Objective:     There were no vitals taken for this visit.  Physical Exam   No results found for any visits on 06/29/23.   Assessment & Plan:  There are no diagnoses linked to this encounter.  No follow-ups on file.    Robert Quitter, PA

## 2023-06-29 NOTE — Assessment & Plan Note (Signed)
Most recent A1c on 04/06/2023 was 11.2.

## 2023-07-02 ENCOUNTER — Other Ambulatory Visit: Payer: Self-pay | Admitting: Family Medicine

## 2023-07-02 DIAGNOSIS — D72829 Elevated white blood cell count, unspecified: Secondary | ICD-10-CM

## 2023-07-03 ENCOUNTER — Other Ambulatory Visit: Payer: Self-pay | Admitting: Family Medicine

## 2023-07-03 DIAGNOSIS — E119 Type 2 diabetes mellitus without complications: Secondary | ICD-10-CM

## 2023-07-06 ENCOUNTER — Other Ambulatory Visit: Payer: BC Managed Care – PPO

## 2023-07-13 ENCOUNTER — Ambulatory Visit: Payer: BC Managed Care – PPO | Admitting: Family Medicine

## 2023-10-29 ENCOUNTER — Encounter: Payer: Self-pay | Admitting: Family Medicine

## 2023-12-18 ENCOUNTER — Telehealth: Payer: Self-pay | Admitting: *Deleted

## 2023-12-18 NOTE — Telephone Encounter (Signed)
 Received a PA for [facility] for this patient and did not see on current med list.   Called pt and he stated that most recently he was taking ozempic and that he just needed one or the other and informed him that I did not see recent Rx sent for either of these and that there was some documentation of him not taking and told him that he would need an appointment to talk about starting one of the two. He said he really doesn't want to be on any of them if he doesn't have to.  Offered an appt and he said he would get back with [place].

## 2023-12-21 ENCOUNTER — Telehealth: Payer: Self-pay | Admitting: *Deleted

## 2023-12-21 ENCOUNTER — Other Ambulatory Visit: Payer: Self-pay | Admitting: Family Medicine

## 2023-12-21 MED ORDER — OZEMPIC (0.25 OR 0.5 MG/DOSE) 2 MG/1.5ML ~~LOC~~ SOPN: 0.5000 mg | PEN_INJECTOR | SUBCUTANEOUS | 0 refills | Status: DC

## 2023-12-21 NOTE — Telephone Encounter (Signed)
 His last A1c was 11 6 months ago.  I will send in what ever he was most recently filled but he needs to come in to the office for management of his diabetes.  Please call him to schedule an appointment with me

## 2023-12-21 NOTE — Telephone Encounter (Signed)
 LVM informing pt of this and requesting him to call and schedule and appt.

## 2023-12-21 NOTE — Telephone Encounter (Signed)
 Lvm to let pt know that a Rx was sent in for Ozempic and to see about scheduling an appointment with dr. Constance Goltz.

## 2024-01-04 ENCOUNTER — Ambulatory Visit (INDEPENDENT_AMBULATORY_CARE_PROVIDER_SITE_OTHER): Payer: Self-pay | Admitting: Family Medicine

## 2024-01-04 ENCOUNTER — Telehealth: Payer: Self-pay | Admitting: *Deleted

## 2024-01-04 VITALS — BP 147/74 | HR 89 | Ht 74.0 in | Wt 346.0 lb

## 2024-01-04 DIAGNOSIS — E119 Type 2 diabetes mellitus without complications: Secondary | ICD-10-CM | POA: Diagnosis not present

## 2024-01-04 DIAGNOSIS — Z7985 Long-term (current) use of injectable non-insulin antidiabetic drugs: Secondary | ICD-10-CM

## 2024-01-04 LAB — POCT GLYCOSYLATED HEMOGLOBIN (HGB A1C): Hemoglobin A1C: 9.7 % — AB (ref 4.0–5.6)

## 2024-01-04 NOTE — Progress Notes (Signed)
 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 Contacted [facility] to confirm that we are only signing off for the A1c on the form that was presented and she said yes.  Patient will have to follow up with the rest of the information on the sheet.  Signed sheet was scanned into patients chart and documented on form that provider was signing off for the A1c only.

## 2024-01-04 NOTE — Telephone Encounter (Signed)
 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  Informed her that I would check with provider but that we had tried to reach him to schedule an appointment.  Informed her that she could schedule an appointment and I would get back with patient about checking his A1c in a lab visit only and would get back with him.

## 2024-01-04 NOTE — Telephone Encounter (Signed)
 Pt walked in and inquired about A1C.   Dr. Arabella Beach approved the POC A1C test via Secure chat.

## 2024-03-01 ENCOUNTER — Ambulatory Visit: Payer: Self-pay | Admitting: Family Medicine

## 2024-03-01 ENCOUNTER — Encounter: Payer: Self-pay | Admitting: Family Medicine

## 2024-03-01 VITALS — BP 167/89 | HR 81 | Ht 74.0 in | Wt 358.4 lb

## 2024-03-01 DIAGNOSIS — Z7984 Long term (current) use of oral hypoglycemic drugs: Secondary | ICD-10-CM

## 2024-03-01 DIAGNOSIS — I10 Essential (primary) hypertension: Secondary | ICD-10-CM

## 2024-03-01 DIAGNOSIS — Z7985 Long-term (current) use of injectable non-insulin antidiabetic drugs: Secondary | ICD-10-CM

## 2024-03-01 DIAGNOSIS — E119 Type 2 diabetes mellitus without complications: Secondary | ICD-10-CM

## 2024-03-01 DIAGNOSIS — K219 Gastro-esophageal reflux disease without esophagitis: Secondary | ICD-10-CM

## 2024-03-01 MED ORDER — LISINOPRIL 20 MG PO TABS: 20.0000 mg | ORAL_TABLET | Freq: Every day | ORAL | 3 refills | Status: AC

## 2024-03-01 MED ORDER — DAPAGLIFLOZIN PROPANEDIOL 10 MG PO TABS: 10.0000 mg | ORAL_TABLET | Freq: Every day | ORAL | 3 refills | Status: AC

## 2024-03-01 MED ORDER — OZEMPIC (0.25 OR 0.5 MG/DOSE) 2 MG/3ML ~~LOC~~ SOPN: 0.5000 mg | PEN_INJECTOR | SUBCUTANEOUS | 2 refills | Status: AC

## 2024-03-01 NOTE — Assessment & Plan Note (Signed)
-   A1C 9.8% one month ago    - Increase Ozempic  from 0.25mg  to 0.5mg  weekly    - Continue Farxiga , metformin  XR    - Return in 3 months for A1C recheck    - Counseled on possible initial side effects with dose increase including nausea, constipation, bloating

## 2024-03-01 NOTE — Progress Notes (Unsigned)
   Established Patient Office Visit  Subjective   Patient ID: Robert Lewis, male    DOB: 04-09-1966  Age: 58 y.o. MRN: [id number]  Chief Complaint  Patient presents with   Diabetes    HPI Subjective - Acid reflux with canned tuna and canned chicken  - No reflux with spicy foods or jalapenos - Tried Pepcid 30 minutes before eating with partial relief  - pt agreeable to increasing ozempic  to .5mg     - pt agreeable to increasing lisinopril  to 20mg .    Medications: amlodipine , Farxiga  (for heart failure), metformin  XR twice daily, Lisinopril  10mg , Ozempic  0.25mg  (reports feeling off first day after injection but tolerates it)  PMH: diabetes mellitus (A1C 9.8% one month ago), hypertension, heart failure, chronic kidney disease  ROS: denies vomiting, reports nausea with Ozempic  initially   The 10-year ASCVD risk score (Arnett DK, et al., 2019) is: 20.7%  Health Maintenance Due  Topic Date Due   Hepatitis C Screening  Never done   Fecal DNA (Cologuard)  Never done   OPHTHALMOLOGY EXAM  10/14/2021   FOOT EXAM  12/31/2022   COVID-19 Vaccine (1 - 2024-25 season) Never done   Diabetic kidney evaluation - Urine ACR  12/02/2023      Objective:     BP (!) 167/89   Pulse 81   Ht 6' 2 (1.88 m)   Wt (!) 358 lb 6.4 oz (162.6 kg)   SpO2 97%   BMI 46.02 kg/m    Physical Exam Gen: alert, oriented Pulm: no resp distress Psych: pleasant affect.   No results found for any visits on 03/01/24.      Assessment & Plan:   Gastroesophageal reflux disease without esophagitis Assessment & Plan:    - Triggered specifically by canned tuna and chicken    - Previously used aciphex with good results    - Tried Pepcid with partial relief    - otc omeprazole recommended to take 30 minutes before triggering foods    - Counseled on avoiding daily long-term PPI use due to SE   Type 2 diabetes mellitus without complication, without long-term current use of insulin   (HCC) Assessment & Plan:    - A1C 9.8% one month ago    - Increase Ozempic  from 0.25mg  to 0.5mg  weekly    - Continue Farxiga , metformin  XR    - Return in 3 months for A1C recheck    - Counseled on possible initial side effects with dose increase including nausea, constipation, bloating  Orders: -     Dapagliflozin  Propanediol; Take 1 tablet (10 mg total) by mouth daily before breakfast.  Dispense: 90 tablet; Refill: 3  Essential hypertension Assessment & Plan:    - Elevated BP in office, consistent with previous readings    - Increase Lisinopril  from 10mg  to 20mg  daily    - Continue amlodipine   Orders: -     Lisinopril ; Take 1 tablet (20 mg total) by mouth daily.  Dispense: 90 tablet; Refill: 3  Other orders -     Ozempic  (0.25 or 0.5 MG/DOSE); Inject 0.5 mg into the skin once a week.  Dispense: 3 mL; Refill: 2     Return in about 3 months (around 06/01/2024) for DM.    Laneta Pintos, MD

## 2024-03-01 NOTE — Assessment & Plan Note (Signed)
-   Elevated BP in office, consistent with previous readings    - Increase Lisinopril  from 10mg  to 20mg  daily    - Continue amlodipine

## 2024-03-01 NOTE — Assessment & Plan Note (Signed)
-   Triggered specifically by canned tuna and chicken    - Previously used aciphex with good results    - Tried Pepcid with partial relief    - otc omeprazole recommended to take 30 minutes before triggering foods    - Counseled on avoiding daily long-term PPI use due to SE

## 2024-03-01 NOTE — Patient Instructions (Addendum)
 It was nice to see you today,  We addressed the following topics today: - I am sending in a refill of your farxiga  - I would like you to increase your ozempic  to 0.5mg  weekly - I am increasing your lisionpril to 20mg .   - you can take over the counter omeprazole or prilosec as needed for acid reflux 30 minutes prior to your meal.    Have a great day,  Etha Henle, MD

## 2024-05-06 ENCOUNTER — Other Ambulatory Visit: Payer: Self-pay | Admitting: Family Medicine

## 2024-05-06 DIAGNOSIS — I10 Essential (primary) hypertension: Secondary | ICD-10-CM

## 2024-06-07 ENCOUNTER — Ambulatory Visit: Payer: Self-pay

## 2024-06-09 ENCOUNTER — Telehealth: Payer: Self-pay

## 2024-06-09 NOTE — Telephone Encounter (Signed)
 Copied from CRM [id number]. Topic: Clinical - Request for Lab/Test Order >> Jun 09, 2024  4:12 PM Tobias L wrote: Reason for CRM: Patient requesting A1c lab orders to be placed. Patient had DOT physical done but is needing recent labwork on A1c.   Patient requesting call once orders have been placed to be able to come in and get labwork done, [phone]

## 2024-06-10 ENCOUNTER — Telehealth: Payer: Self-pay | Admitting: *Deleted

## 2024-06-10 ENCOUNTER — Other Ambulatory Visit: Payer: Self-pay

## 2024-06-10 ENCOUNTER — Ambulatory Visit: Payer: Self-pay

## 2024-06-10 DIAGNOSIS — I10 Essential (primary) hypertension: Secondary | ICD-10-CM

## 2024-06-10 DIAGNOSIS — E119 Type 2 diabetes mellitus without complications: Secondary | ICD-10-CM

## 2024-06-10 DIAGNOSIS — Z7985 Long-term (current) use of injectable non-insulin antidiabetic drugs: Secondary | ICD-10-CM

## 2024-06-10 DIAGNOSIS — E785 Hyperlipidemia, unspecified: Secondary | ICD-10-CM

## 2024-06-10 LAB — POCT GLYCOSYLATED HEMOGLOBIN (HGB A1C): Hemoglobin A1C: 11.7 % — AB (ref 4.0–5.6)

## 2024-06-10 NOTE — Telephone Encounter (Signed)
 I have placed lab orders for the patient so he can come in anytime to have those done.   He needs a follow up on HTN and DM set up with me sometime this month or early October -- He last saw Chandra in June and instructions were to f/u in 3 months. Thanks!

## 2024-06-10 NOTE — Telephone Encounter (Signed)
 Tried calling the patient; NO voicemail set up. Sent a [facility].

## 2024-06-10 NOTE — Addendum Note (Signed)
 Addended by: ZIMMERMAN RUMPLE, Novi Calia D on: 06/10/2024 09:48 AM   Modules accepted: Orders

## 2024-06-10 NOTE — Telephone Encounter (Signed)
 Copied from CRM [id number]. Topic: Appointments - Appointment Info/Confirmation >> Jun 10, 2024 12:23 PM Delon DASEN wrote: Patient returning call about an appt, he is already scheduled for a follow up on 10/28

## 2024-06-12 ENCOUNTER — Ambulatory Visit: Payer: Self-pay

## 2024-06-13 LAB — HEMOGLOBIN A1C
Est. average glucose Bld gHb Est-mCnc: 283 mg/dL
Hgb A1c MFr Bld: 11.5 % — AB (ref 4.8–5.6)

## 2024-06-13 LAB — COMPREHENSIVE METABOLIC PANEL WITH GFR
ALT: 19 IU/L (ref 0–44)
AST: 9 IU/L (ref 0–40)
Albumin: 4.3 g/dL (ref 3.8–4.9)
Alkaline Phosphatase: 100 IU/L (ref 47–123)
BUN/Creatinine Ratio: 14 (ref 9–20)
BUN: 10 mg/dL (ref 6–24)
Bilirubin Total: 0.6 mg/dL (ref 0.0–1.2)
CO2: 22 mmol/L (ref 20–29)
Calcium: 9.1 mg/dL (ref 8.7–10.2)
Chloride: 99 mmol/L (ref 96–106)
Creatinine, Ser: 0.69 mg/dL — ABNORMAL LOW (ref 0.76–1.27)
Globulin, Total: 2.7 g/dL (ref 1.5–4.5)
Glucose: 344 mg/dL — ABNORMAL HIGH (ref 70–99)
Potassium: 4.1 mmol/L (ref 3.5–5.2)
Sodium: 137 mmol/L (ref 134–144)
Total Protein: 7 g/dL (ref 6.0–8.5)
eGFR: 109 mL/min/1.73 (ref >59)

## 2024-06-13 LAB — CBC WITH DIFFERENTIAL/PLATELET
Basophils Absolute: 0.1 x10E3/uL (ref 0.0–0.2)
Basos: 1 %
EOS (ABSOLUTE): 0.2 x10E3/uL (ref 0.0–0.4)
Eos: 2 %
Hematocrit: 52 % — ABNORMAL HIGH (ref 37.5–51.0)
Hemoglobin: 16.2 g/dL (ref 13.0–17.7)
Immature Grans (Abs): 0.1 x10E3/uL (ref 0.0–0.1)
Immature Granulocytes: 1 %
Lymphocytes Absolute: 2 x10E3/uL (ref 0.7–3.1)
Lymphs: 22 %
MCH: 30 pg (ref 26.6–33.0)
MCHC: 31.2 g/dL — ABNORMAL LOW (ref 31.5–35.7)
MCV: 96 fL (ref 79–97)
Monocytes Absolute: 0.7 x10E3/uL (ref 0.1–0.9)
Monocytes: 8 %
Neutrophils Absolute: 6.2 x10E3/uL (ref 1.4–7.0)
Neutrophils: 66 %
Platelets: 233 x10E3/uL (ref 150–450)
RBC: 5.4 x10E6/uL (ref 4.14–5.80)
RDW: 12.5 % (ref 11.6–15.4)
WBC: 9.2 x10E3/uL (ref 3.4–10.8)

## 2024-06-13 LAB — TSH: TSH: 1.37 u[IU]/mL (ref 0.450–4.500)

## 2024-06-13 LAB — LIPID PANEL
Chol/HDL Ratio: 4.1 ratio (ref 0.0–5.0)
Cholesterol, Total: 218 mg/dL — ABNORMAL HIGH (ref 100–199)
HDL: 53 mg/dL (ref >39)
LDL Chol Calc (NIH): 146 mg/dL — ABNORMAL HIGH (ref 0–99)
Triglycerides: 106 mg/dL (ref 0–149)
VLDL Cholesterol Cal: 19 mg/dL (ref 5–40)

## 2024-06-13 LAB — VITAMIN D 25 HYDROXY (VIT D DEFICIENCY, FRACTURES): Vit D, 25-Hydroxy: 11 ng/mL — ABNORMAL LOW (ref 30.0–100.0)

## 2024-07-19 ENCOUNTER — Ambulatory Visit: Payer: Self-pay
